# Patient Record
Sex: Male | Born: 1955 | Hispanic: Yes | Smoking: Former smoker
Health system: Southern US, Community
[De-identification: ages and names within clinical notes are randomized; demographics above are authoritative.]

## PROBLEM LIST (undated history)

## (undated) DIAGNOSIS — I10 Essential (primary) hypertension: Secondary | ICD-10-CM

---

## 2019-10-20 ENCOUNTER — Encounter: Payer: Self-pay | Admitting: Pulmonary Disease

## 2019-10-20 ENCOUNTER — Other Ambulatory Visit: Payer: Self-pay

## 2019-10-20 ENCOUNTER — Ambulatory Visit (INDEPENDENT_AMBULATORY_CARE_PROVIDER_SITE_OTHER): Payer: 59 | Admitting: Pulmonary Disease

## 2019-10-20 VITALS — BP 130/68 | HR 72 | Temp 97.7°F | Ht 61.0 in | Wt 176.6 lb

## 2019-10-20 DIAGNOSIS — G4733 Obstructive sleep apnea (adult) (pediatric): Secondary | ICD-10-CM | POA: Diagnosis not present

## 2019-10-20 NOTE — Patient Instructions (Signed)
High probability of significant obstructive sleep apnea  Schedule a home sleep study We will update your results as soon as reviewed  CPAP therapy will be a option of treatment as we discussed  Follow-up in about 3 months  Call with any concerns Apnea del sueo Sleep Apnea La apnea del sueo afecta la respiracin mientras se duerme. Hace que la respiracin se detenga por poco tiempo o se vuelva superficial. Tambin puede aumentar el riesgo de:  Infarto de miocardio.  Accidente cerebrovascular.  Tener mucho sobrepeso (obesidad).  Diabetes.  Insuficiencia cardaca.  Latidos cardacos irregulares. El [place] del tratamiento es ayudarle a respirar normalmente otra vez. Cules son las causas? Existen tres tipos de apnea del sueo:  Apnea obstructiva del sueo. Esta ocurre cuando las vas respiratorias se obstruyen o colapsan.  Apnea central del sueo. Esta ocurre cuando el cerebro no enva las seales correctas a los msculos que controlan la respiracin.  Apnea mixta del sueo. Esta es una combinacin de apnea obstructiva y central del sueo. La causa ms frecuente de esta afeccin es la obstruccin o el colapso de las vas respiratorias. Esto puede suceder si:  Los msculos de la garganta estn demasiado relajados.  Tiene la lengua y las [address].  Tiene sobrepeso.  Tiene las vas respiratorias demasiado pequeas. Qu incrementa el riesgo?  Tener sobrepeso.  Fumar.  Tener vas respiratorias pequeas.  El envejecimiento.  Ser hombre.  El consumo de alcohol.  Tomar medicamentos para calmarse (sedantes o tranquilizantes).  Tener familiares con esta afeccin. Cules son los signos o los sntomas?  Dificultad para permanecer dormido.  Estar somnoliento o cansado Administrator.  Enojarse mucho.  Ronquidos fuertes.  Dolor de cabeza por la maana.  Imposibilidad de enfocar la mente (concentrarse).  Olvidar cosas.  Menos inters  por el sexo.  Cambios en el estado de nimo.  Cambios en la personalidad.  Sentimientos de tristeza (depresin).  Levantarse mucho durante la noche para ir a Geographical information systems officer.  Sequedad en la boca.  Dolor de Advertising copywriter. Cmo se diagnostica?  Sus antecedentes mdicos.  Un examen fsico.  Neomia Dear prueba que se realiza mientras la persona duerme (estudio del sueo). La prueba se realiza con mayor frecuencia en un laboratorio del sueo, pero tambin puede Management consultant. Cmo se trata?   Dormir de Mudlogger.  Usar un medicamento para eliminar la mucosidad de la nariz (descongestivo).  Evitar el consumo de alcohol, medicamentos que ayudan a relajarse o ciertos analgsicos (narcticos).  Bajar de [place], si es necesario.  Cambios en la dieta.  No fumar.  Usar una mquina para abrir las vas respiratorias mientras duerme; por ejemplo: ? Un aparato bucal. Se trata de una boquilla que desplaza la mandbula hacia adelante. ? Un dispositivo CPAP. Este dispositivo sopla aire a travs de una mscara cuando usted exhala. ? Un dispositivo EPAP. Este tiene vlvulas que se colocan en cada fosa nasal. ? Un dispositivo BPAP. Este dispositivo sopla aire a travs de una mscara cuando usted inhala y exhala.  Someterse a Biomedical engineer tratamientos no Comptroller. Realizar un tratamiento para la apnea del sueo es importante. Sin tratamiento, esta afeccin puede derivar en lo siguiente:  Presin arterial alta.  Arteriopata coronaria.  En los hombres, no poder tener una ereccin (impotencia).  Reduccin de la capacidad de pensar. Siga estas instrucciones en su casa: Estilo de [employer] cambios que le haya recomendado el mdico.  Siga una dieta saludable.  Baje de [place],  si es necesario.  Evite el alcohol, los medicamentos para relajarse y Scientific laboratory technician.  No consuma ningn producto que contenga nicotina o tabaco, como cigarrillos, cigarrillos electrnicos y tabaco  de Theatre manager. Si necesita ayuda para dejar de fumar, consulte al [employer]. Instrucciones generales  [employer] de venta libre y los recetados solamente como se lo haya indicado el mdico.  Si le proporcionaron una mquina para usar mientras duerme, sela solamente como se lo haya indicado el mdico.  Si va a someterse a [place], no olvide informarle al mdico que tiene apnea del sueo. Puede ser necesario que lleve su dispositivo consigo.  Concurra a todas las visitas de [address] se lo haya indicado el mdico. Esto es importante. Comunquese con un mdico si:  El Astronomer para usar mientras duerme le [place] o parece no funcionar.  No se siente mejor.  Empeora. Solicite ayuda inmediatamente si:  Le duele el pecho.  Tiene dificultad para inhalar suficiente aire.  Tiene molestias en la espalda, en los brazos o en el [place].  Tiene dificultad para hablar.  Siente debilidad en un lado del cuerpo.  Se le cae un lado de la cara. Estos sntomas pueden Customer service manager. No espere a ver si los sntomas desaparecen. Solicite atencin mdica de inmediato. Comunquese con el servicio de emergencias de su localidad (911 en los [place]). No conduzca por sus propios medios [employer] [facility]. Resumen  Esta afeccin afecta la respiracin durante el sueo.  La causa ms frecuente es la obstruccin o el colapso de las vas respiratorias.  El [place] del tratamiento es ayudarlo a respirar normalmente mientras duerme. Esta informacin no tiene Theme park manager el consejo del mdico. Asegrese de hacerle al mdico cualquier pregunta que tenga. Document Revised: 11/10/2017 Document Reviewed: 11/10/2017 Elsevier Patient Education  2020 [employer].

## 2019-10-20 NOTE — Progress Notes (Signed)
Thomas Glass    [id number]    1955/05/06  Primary Care Physician:Vanstory, Suzan Slick, PA  Referring Physician: Norm Salt, PA [address] [place],  [place] [address]  Chief complaint:   History of significant snoring  HPI:  Snoring, no witnessed apneas Daytime tiredness Usually goes to bed about 6:54 PM Takes him about 5 minutes to fall asleep 3 awakenings Occasionally wakes up from sleep with shortness of breath  No significant changes in his weight  Admits to dryness of his mouth in the morning No headaches  Memory is poor  No family history of obstructive sleep apnea  He has hypertension which is well controlled Heart diabetes well controlled   Outpatient Encounter Medications as of 10/20/2019  Medication Sig  . losartan (COZAAR) 25 MG tablet Take 25 mg by mouth daily.   No facility-administered encounter medications on file as of 10/20/2019.    Allergies as of 10/20/2019  . (Not on File)    History reviewed. No pertinent past medical history.  History reviewed. No pertinent surgical history.  History reviewed. No pertinent family history.  Social History   Socioeconomic History  . Marital status: Media planner    Spouse name: Not on file  . Number of children: Not on file  . Years of education: Not on file  . Highest education level: Not on file  Occupational History  . Not on file  Tobacco Use  . Smoking status: Former Smoker    Packs/day: 0.25    Years: 20.00    Pack years: 5.00    Types: Cigarettes    Quit date: 03/03/1999    Years since quitting: 20.6  . Smokeless tobacco: Never Used  Substance and Sexual Activity  . Alcohol use: Not on file  . Drug use: Not on file  . Sexual activity: Not on file  Other Topics Concern  . Not on file  Social History Narrative  . Not on file   Social Determinants of Health   Financial Resource Strain:   . Difficulty of Paying Living Expenses: Not on file  Food  Insecurity:   . Worried About Programme researcher, broadcasting/film/video in the Last Year: Not on file  . Ran Out of Food in the Last Year: Not on file  Transportation Needs:   . Lack of Transportation (Medical): Not on file  . Lack of Transportation (Non-Medical): Not on file  Physical Activity:   . Days of Exercise per Week: Not on file  . Minutes of Exercise per Session: Not on file  Stress:   . Feeling of Stress : Not on file  Social Connections:   . Frequency of Communication with Friends and Family: Not on file  . Frequency of Social Gatherings with Friends and Family: Not on file  . Attends Religious Services: Not on file  . Active Member of Clubs or Organizations: Not on file  . Attends Banker Meetings: Not on file  . Marital Status: Not on file  Intimate Partner Violence:   . Fear of Current or Ex-Partner: Not on file  . Emotionally Abused: Not on file  . Physically Abused: Not on file  . Sexually Abused: Not on file    Review of Systems  Constitutional: Negative.  Negative for fatigue.  Respiratory: Negative for apnea and shortness of breath.   Psychiatric/Behavioral: Positive for sleep disturbance.    Vitals:   10/20/19 1559  BP: 130/68  Pulse: 72  Temp: 97.7 F (36.5 C)  SpO2: 96%     Physical Exam Constitutional:      Appearance: He is obese.  HENT:     Head: Normocephalic.     Nose: No congestion.     Mouth/Throat:     Mouth: Mucous membranes are moist.     Comments: Mallampati 3, crowded oropharynx Cardiovascular:     Rate and Rhythm: Normal rate and regular rhythm.     Pulses: Normal pulses.     Heart sounds: No murmur heard.  No friction rub.  Pulmonary:     Effort: Pulmonary effort is normal. No respiratory distress.     Breath sounds: Normal breath sounds. No stridor. No wheezing or rhonchi.  Musculoskeletal:        General: Normal range of motion.     Cervical back: No rigidity or tenderness.  Skin:    General: Skin is warm.  Neurological:      General: No focal deficit present.     Mental Status: He is alert.    Results of the Epworth flowsheet 10/20/2019  Sitting and reading 2  Watching TV 3  Sitting, inactive in a public place (e.g. a theatre or a meeting) 0  As a passenger in a car for an hour without a break 0  Lying down to rest in the afternoon when circumstances permit 1  Sitting and talking to someone 2  Sitting quietly after a lunch without alcohol 3  In a car, while stopped for a few minutes in traffic 0  Total score 11    Assessment:  Excessive daytime sleepiness  Moderate probability of significant obstructive sleep apnea  Pathophysiology of sleep disordered breathing discussed with the patient Treatment options for sleep disordered breathing discussed with the patient  Plan/Recommendations: Schedule patient for home sleep study  Risks with not treating sleep disordered breathing discussed with patient  We will see patient in about 3 months, following initiation of CPAP therapy  Encouraged to call with any significant concerns   Virl Diamond MD [facility] 10/20/2019, 4:27 PM  CC: Norm Salt, PA

## 2019-11-20 ENCOUNTER — Ambulatory Visit: Payer: 59

## 2019-11-20 ENCOUNTER — Other Ambulatory Visit: Payer: Self-pay

## 2019-11-20 DIAGNOSIS — G4733 Obstructive sleep apnea (adult) (pediatric): Secondary | ICD-10-CM

## 2019-11-22 ENCOUNTER — Telehealth: Payer: Self-pay | Admitting: Pulmonary Disease

## 2019-11-22 DIAGNOSIS — G4733 Obstructive sleep apnea (adult) (pediatric): Secondary | ICD-10-CM | POA: Diagnosis not present

## 2019-11-22 NOTE — Telephone Encounter (Signed)
Atc via interterprtor LMTCB

## 2019-11-22 NOTE — Telephone Encounter (Signed)
Call patient  Sleep study result  Date of study: 11/21/2019  Impression: Mild obstructive sleep apnea with significant daytime symptoms Normal oxygen levels  Recommendation: CPAP therapy should be considered for treatment of obstructive sleep apnea with daytime symptoms Auto titrating CPAP settings of 5-15 will be appropriate  Another option of treatment will be an oral device-this will require referring you to a dentist to fashion an oral device  Follow-up in 4 to 6 weeks following initiation of treatment

## 2019-11-23 NOTE — Telephone Encounter (Signed)
Spouse not on DPR  Have to speak with the pt only  Called him using [employer] and had to [facility]

## 2019-11-23 NOTE — Telephone Encounter (Signed)
Patient wife called for results [phone] -pr

## 2019-11-27 NOTE — Telephone Encounter (Signed)
Called spoke with pt wife explained pro and cons of cpap therapy and also offered option of  Dental device and pro and cons.pt wife stated he would like to do cpap therapy and informed pt wife he needed to call and make a 4 to 6 week follow up after starting cpap therapy

## 2019-12-21 ENCOUNTER — Telehealth: Payer: Self-pay | Admitting: Pulmonary Disease

## 2019-12-21 NOTE — Telephone Encounter (Addendum)
Called Brad at [employer].  He states Stephanie at [employer] has called pt on 10/6 and today and had to leave a vm - trying to get cpap scheduled.  I called Nilsa and gave her phone # to customer service and told her to tell them she is calling to get cpap scheduled for pt.  She states [employer] has been calling wrong #.  Told her to give them the correct # when she has them on the phone.  Nothing further needed.

## 2020-04-18 DIAGNOSIS — N522 Drug-induced erectile dysfunction: Secondary | ICD-10-CM | POA: Diagnosis not present

## 2020-04-18 DIAGNOSIS — R7303 Prediabetes: Secondary | ICD-10-CM | POA: Diagnosis not present

## 2020-04-18 DIAGNOSIS — Z720 Tobacco use: Secondary | ICD-10-CM | POA: Diagnosis not present

## 2020-04-18 DIAGNOSIS — M7750 Other enthesopathy of unspecified foot: Secondary | ICD-10-CM | POA: Diagnosis not present

## 2020-04-18 DIAGNOSIS — I10 Essential (primary) hypertension: Secondary | ICD-10-CM | POA: Diagnosis not present

## 2020-04-18 DIAGNOSIS — E782 Mixed hyperlipidemia: Secondary | ICD-10-CM | POA: Diagnosis not present

## 2020-07-19 ENCOUNTER — Ambulatory Visit
Admission: EM | Admit: 2020-07-19 | Discharge: 2020-07-19 | Disposition: A | Discharge status: Home / Self Care | Payer: Medicare Other | Attending: Family Medicine | Admitting: Family Medicine

## 2020-07-19 ENCOUNTER — Ambulatory Visit (INDEPENDENT_AMBULATORY_CARE_PROVIDER_SITE_OTHER): Payer: Medicare Other

## 2020-07-19 ENCOUNTER — Other Ambulatory Visit: Payer: Self-pay

## 2020-07-19 DIAGNOSIS — U071 COVID-19: Secondary | ICD-10-CM

## 2020-07-19 DIAGNOSIS — R0602 Shortness of breath: Secondary | ICD-10-CM | POA: Diagnosis not present

## 2020-07-19 DIAGNOSIS — R0902 Hypoxemia: Secondary | ICD-10-CM

## 2020-07-19 DIAGNOSIS — B349 Viral infection, unspecified: Secondary | ICD-10-CM

## 2020-07-19 HISTORY — DX: Essential (primary) hypertension: I10

## 2020-07-19 MED ORDER — CEFDINIR 300 MG PO CAPS: 300.0000 mg | ORAL_CAPSULE | Freq: Two times a day (BID) | ORAL | 0 refills | Status: AC

## 2020-07-19 MED ORDER — ALBUTEROL SULFATE HFA 108 (90 BASE) MCG/ACT IN AERS
2.0000 | INHALATION_SPRAY | Freq: Once | RESPIRATORY_TRACT | Status: AC
  Administered 2020-07-19: 2 via RESPIRATORY_TRACT

## 2020-07-19 MED ORDER — PROMETHAZINE-DM 6.25-15 MG/5ML PO SYRP: 5.0000 mL | ORAL_SOLUTION | Freq: Three times a day (TID) | ORAL | 0 refills | Status: AC | PRN

## 2020-07-19 MED ORDER — DEXAMETHASONE SODIUM PHOSPHATE 10 MG/ML IJ SOLN
10.0000 mg | Freq: Once | INTRAMUSCULAR | Status: AC
  Administered 2020-07-19: 10 mg via INTRAMUSCULAR

## 2020-07-19 NOTE — ED Triage Notes (Addendum)
Pt c/o cough and SOB x4 days. States saw his PCP yesterday and his O2 was 87% and tx's him with antibiotics and steroids. States neg covid test yesterday. States SOB worse on exertion. Pt c/o pain to rt upper back pain and worse when taking a deep breath. Pt in distress, speaking in complete sentences.

## 2020-07-19 NOTE — Discharge Instructions (Signed)
Continue prednisone and azithromycin that you are prescribed by your primary care doctor.  I am adding cefdinir as you do have bilateral pneumonia.  Suspected to be viral pneumonia stemming from what was probably a earlier COVID infection which is since resolved since she tested negative yesterday.  If you experience any worsening shortness of breath, air hunger or difficulty breathing go immediately to the emergency department as this can a sign of a life-threatening emergency.

## 2020-07-19 NOTE — ED Provider Notes (Signed)
[facility]    CSN: [id number] Arrival date & time: 07/19/20  1624      History   Chief Complaint Chief Complaint  Patient presents with  . Cough  . Shortness of Breath    HPI Thomas Glass is a 65 y.o. male.   HPI Patient presents today with worsening SOB and coughing. Seen by PCP yesterday, encounter noted as positive COVID, however, testing was conducted yesterday, and COVID test yesterday was negative. Patient has had respiratory symptoms per spouse for greater than 2 weeks. Only tested for COVID yesterday.  On arrival initial oxygen level 87-88%. Following 2 puffs of albuterol and Decadron IM, oxygen lever improved to 91% and subsequently 93% prior to discharge. Patient is chronic smoker, dx with OSA, doesn't use CPAP. Afebrile.  No known history of CHF. Currently taking a course of prescribed Azithromycin and prednisone.  Past Medical History:  Diagnosis Date  . Hypertension     There are no problems to display for this patient.   History reviewed. No pertinent surgical history.     Home Medications    Prior to Admission medications   Medication Sig Start Date End Date Taking? Authorizing Provider  cefdinir (OMNICEF) 300 MG capsule Take 1 capsule (300 mg total) by mouth 2 (two) times daily for 10 days. 07/19/20 07/29/20 Yes Bing Neighbors, FNP  losartan (COZAAR) 25 MG tablet Take 25 mg by mouth daily.    [provider]    Family History History reviewed. No pertinent family history.  Social History Social History   Tobacco Use  . Smoking status: Current Some Day Smoker    Packs/day: 0.25    Years: 20.00    Pack years: 5.00    Types: Cigarettes    Last attempt to quit: 03/03/1999    Years since quitting: 21.3  . Smokeless tobacco: Never Used  Substance Use Topics  . Alcohol use: Yes  . Drug use: Not Currently     Allergies   Patient has no known allergies.   Review of Systems Review of Systems Pertinent negatives  listed in HPI   Physical Exam Triage Vital Signs ED Triage Vitals  Enc Vitals Group     BP 07/19/20 1910 133/82     Pulse Rate 07/19/20 1910 85     Resp 07/19/20 1910 (!) 24     Temp 07/19/20 1910 98 F (36.7 C)     Temp Source 07/19/20 1910 Oral     SpO2 07/19/20 1910 (!) 87 %     Weight --      Height --      Head Circumference --      Peak Flow --      Pain Score 07/19/20 1913 6     Pain Loc --      Pain Edu? --      Excl. in GC? --    No data found.  Updated Vital Signs BP 133/82 (BP Location: Left Arm)   Pulse 85   Temp 98 F (36.7 C) (Oral)   Resp (!) 24   SpO2 91%   Visual Acuity Right Eye Distance:   Left Eye Distance:   Bilateral Distance:    Right Eye Near:   Left Eye Near:    Bilateral Near:     Physical Exam Constitutional:      Appearance: He is obese. He is ill-appearing. He is not toxic-appearing.  HENT:     Head: Normocephalic.     Mouth/Throat:  Mouth: Mucous membranes are moist.  Eyes:     Pupils: Pupils are equal, round, and reactive to light.  Cardiovascular:     Rate and Rhythm: Normal rate and regular rhythm.  Pulmonary:     Effort: Tachypnea present.     Breath sounds: Decreased breath sounds, wheezing, rhonchi and rales present.  Musculoskeletal:     Cervical back: Normal range of motion.  Skin:    Capillary Refill: Capillary refill takes less than 2 seconds.  Neurological:     General: No focal deficit present.     Mental Status: He is alert.  Psychiatric:        Mood and Affect: Mood normal.        Behavior: Behavior normal.      UC Treatments / Results  Labs (all labs ordered are listed, but only abnormal results are displayed) Labs Reviewed - No data to display  EKG   Radiology DG Chest 2 View  Result Date: 07/19/2020 CLINICAL DATA:  COVID-19 positivity with shortness of breath, initial encounter EXAM: CHEST - 2 VIEW COMPARISON:  None. FINDINGS: Cardiac shadow is within normal limits. The lungs are well  aerated bilaterally. Diffuse airspace opacities are noted bilaterally. Given the patient's clinical history these likely represent sequelae from COVID-19 pneumonia although no prior exams are available for comparison. Upper abdomen and bony structures are within normal limits. IMPRESSION: Bilateral airspace opacities likely related to COVID-19 pneumonia. Follow-up examination is recommended when patient's condition improves. Electronically Signed   By: Alcide Clever M.D.   On: 07/19/2020 19:33    Procedures Procedures (including critical care time)  Medications Ordered in UC Medications  albuterol (VENTOLIN HFA) 108 (90 Base) MCG/ACT inhaler 2 puff (2 puffs Inhalation Given 07/19/20 1923)  dexamethasone (DECADRON) injection 10 mg (10 mg Intramuscular Given 07/19/20 1923)    Initial Impression / Assessment and Plan / UC Course  I have reviewed the triage vital signs and the nursing notes.  Pertinent labs & imaging results that were available during my care of the patient were reviewed by me and considered in my medical decision making (see chart for details).    Seen by PCP tested for COVID which resulted as negative. Given overall presentation and URI symptoms > 2 weeks, suspect at some point, patient likely had COVID-19 and now has a secondary bilateral lobe pneumonia. Discussed at length with patient and spouse that If WOB breathing doesn't improve or worsen, patient immediately needs to be evaluated at the ER.  Give smoking history and non-compliance with therapy for OSA high risk for acute respiratory failure.Continue current medication. Added cefdinir to cover both typical and atypica PNA. Patient and wife verbalized understanding agreement with plan  Final Clinical Impressions(s) / UC Diagnoses   Final diagnoses:  SOB (shortness of breath)  Hypoxic  Viral illness   Discharge Instructions   None    ED Prescriptions    Medication Sig Dispense Auth. Provider   cefdinir (OMNICEF) 300  MG capsule Take 1 capsule (300 mg total) by mouth 2 (two) times daily for 10 days. 20 capsule Bing Neighbors, FNP     PDMP not reviewed this encounter.   Bing Neighbors, FNP 07/19/20 2222

## 2020-07-22 ENCOUNTER — Telehealth: Payer: Self-pay | Admitting: Pulmonary Disease

## 2020-07-22 NOTE — Telephone Encounter (Signed)
Spoke with pt's family member with pt in background. States pt went to [facility] on Friday 07/19/20, pt was diagnosed with pneumonia and sent home. Family member states pt has had increased SOB since that time. Pt's O2 saturation is around 90%. Pt is scheduled with [facility] tomorrow at 11:30. Pt's family member was instructed that if pt's symptoms increase they need to seek an evaluation at either ER or UR. Pt and family member stated understanding. Nothing further needed at this time.

## 2020-07-22 NOTE — Telephone Encounter (Signed)
Lm for Thomas Glass via interpreter services.

## 2020-07-23 ENCOUNTER — Other Ambulatory Visit: Payer: Self-pay

## 2020-07-23 ENCOUNTER — Ambulatory Visit (INDEPENDENT_AMBULATORY_CARE_PROVIDER_SITE_OTHER): Payer: Medicare Other | Admitting: Primary Care

## 2020-07-23 ENCOUNTER — Encounter: Payer: Self-pay | Admitting: Primary Care

## 2020-07-23 VITALS — BP 114/60 | HR 94 | Temp 97.9°F | Ht 64.0 in | Wt 176.8 lb

## 2020-07-23 DIAGNOSIS — G4733 Obstructive sleep apnea (adult) (pediatric): Secondary | ICD-10-CM | POA: Insufficient documentation

## 2020-07-23 DIAGNOSIS — J189 Pneumonia, unspecified organism: Secondary | ICD-10-CM

## 2020-07-23 DIAGNOSIS — J9601 Acute respiratory failure with hypoxia: Secondary | ICD-10-CM | POA: Insufficient documentation

## 2020-07-23 MED ORDER — METHYLPREDNISOLONE ACETATE 80 MG/ML IJ SUSP: 80.0000 mg | Freq: Once | INTRAMUSCULAR | Status: AC

## 2020-07-23 MED ORDER — PREDNISONE 10 MG PO TABS: ORAL_TABLET | ORAL | 0 refills | Status: AC

## 2020-07-23 NOTE — Patient Instructions (Addendum)
Recommendations: - Continue Cefdinir 300mg  twice a day (total 10 days) - Take Mucinex 600mg  twice a day with glass of water  - Take albuterol inhaler 2 puffs every 4-6 hours (take 8am, 12pm, 4pm, 8pm) - Wear 2L oxygen with moderate on exertion and at night  - Start prednisone taper tomorrow   Rx: - Prednisone taper as directed   In Office treatment: - 80mg  IM depo-medrol injection   Orders: - CT chest wo contrast (ordered) - New oxygen start   Follow-up: - Friday May 27th with Dr. at 2:30 or 2:45 please

## 2020-07-23 NOTE — Assessment & Plan Note (Signed)
-   HST 11/21/19 showed mild OSA, AHI 9.6/Hr with SpO2 low 85% with average 94%  - He has not been set up with CPAP  - FU with Dr. Wynona Neat 07/26/20

## 2020-07-23 NOTE — Progress Notes (Signed)
@Patient  ID: , male    DOB: 06/10/1955, 65 y.o.   MRN: [id number]  Chief Complaint  Patient presents with  . Follow-up    Has had increased shortness of breath for the last 5 days. Patient got antibiotic from urgent care. Does not wear oxygen.     Referring provider: [id number], PA  HPI: 65 year old male, current someday smoker.  Past medical history significant for obstructive sleep apnea.  Patient of Dr. 76, last seen in office on 10/20/2019.  07/23/2020 Patient presents today for overdue follow-up. Patient went to [facility] on Friday 07/19/20 with symptoms of shortness of breath and cough. On arrival O2 87-88% RA. He received 2 puffs albuterol hfa and IM Decadron. Oxygen improved to 91-95% RA. Seen by PCP day earlier for suspected covid but testing came back negative. His symptoms started two weeks prior. Prescribed azithromycin and prednisone by PCP. CXR on 07/19/20 showed bilateral airspace opacities likely related to covid 19 pneumonia. It was felt that at some point he likely had covid-19 and now has bilateral pneumonia. When he was at the UC they added cefdinir 300mg  BID 10 days to cover both typical and atypical pna.   Accompanied by his wife and interpreter today. Reports that he is feeling ok. He has a slight cough with some clear mucus. O2 82% RA today with ambulation; O2 93-94% RA at rest.  He tells me that he has had theses symptoms before several years ago. He does not think that he had covid. He has sleep apnea but never received CPAP. Denies f/c/s, chest tightness or wheezing.    No Known Allergies   There is no immunization history on file for this patient.  Past Medical History:  Diagnosis Date  . Hypertension     Tobacco History: Social History   Tobacco Use  Smoking Status Former Smoker  . Packs/day: 0.25  . Years: 20.00  . Pack years: 5.00  . Types: Cigarettes  . Quit date: 03/03/1999  . Years since quitting: 21.4  Smokeless Tobacco Never  Used   Counseling given: Not Answered   Outpatient Medications Prior to Visit  Medication Sig Dispense Refill  . cefdinir (OMNICEF) 300 MG capsule Take 1 capsule (300 mg total) by mouth 2 (two) times daily for 10 days. 20 capsule 0  . losartan (COZAAR) 25 MG tablet Take 25 mg by mouth daily.    . promethazine-dextromethorphan (PROMETHAZINE-DM) 6.25-15 MG/5ML syrup Take 5 mLs by mouth 3 (three) times daily as needed for cough. 140 mL 0   No facility-administered medications prior to visit.   Review of Systems  Review of Systems  Constitutional: Negative.   HENT: Negative.   Respiratory: Positive for shortness of breath. Negative for chest tightness and wheezing.        Rare cough   Physical Exam  BP 114/60 (BP Location: Left Arm, Patient Position: Sitting, Cuff Size: Normal)   Pulse 94   Temp 97.9 F (36.6 C) (Temporal)   Ht 5\' 4"  (1.626 m)   Wt 176 lb 12.8 oz (80.2 kg)   SpO2 95% Comment: 2 liters  BMI 30.35 kg/m  Physical Exam Constitutional:      Appearance: Normal appearance.  HENT:     Head: Normocephalic and atraumatic.  Cardiovascular:     Rate and Rhythm: Normal rate and regular rhythm.  Pulmonary:     Breath sounds: Rales present.  Musculoskeletal:        General: Normal range of motion.  Skin:  General: Skin is warm and dry.  Neurological:     General: No focal deficit present.     Mental Status: He is alert and oriented to person, place, and time. Mental status is at baseline.  Psychiatric:        Mood and Affect: Mood normal.        Behavior: Behavior normal.        Thought Content: Thought content normal.        Judgment: Judgment normal.      Lab Results:  CBC No results found for: WBC, RBC, HGB, HCT, PLT, MCV, MCH, MCHC, RDW, LYMPHSABS, MONOABS, EOSABS, BASOSABS  BMET No results found for: NA, K, CL, CO2, GLUCOSE, BUN, CREATININE, CALCIUM, GFRNONAA, GFRAA  BNP No results found for: BNP  ProBNP No results found for:  PROBNP  Imaging: DG Chest 2 View  Result Date: 07/19/2020 CLINICAL DATA:  COVID-19 positivity with shortness of breath, initial encounter EXAM: CHEST - 2 VIEW COMPARISON:  None. FINDINGS: Cardiac shadow is within normal limits. The lungs are well aerated bilaterally. Diffuse airspace opacities are noted bilaterally. Given the patient's clinical history these likely represent sequelae from COVID-19 pneumonia although no prior exams are available for comparison. Upper abdomen and bony structures are within normal limits. IMPRESSION: Bilateral airspace opacities likely related to COVID-19 pneumonia. Follow-up examination is recommended when patient's condition improves. Electronically Signed   By: Alcide Clever M.D.   On: 07/19/2020 19:33     Assessment & Plan:   Pneumonia - Increased sob x 2 weeks and occasional cough with clear mucus. He feels well today. Treated for pneumonia by PCP with azithromycin and is currently on Cefdinir prescribed by UC. On exam, he has rales to both lungs L>R. Recommend checking CT chest wo contrast d/t persistent pneumonia, concern for possible ILD. Recommend he take mucinex 600mg  twice daily and albuterol HFA 2 puffs every 4-6 hours scheduled. He was given depo-medrol 80mg  IM x1 today in office and we will send in prednisone taper. He will complete cefdinir as prescribed. Close follow-up in office, he has been scheduled for apt on Friday May 27th with Dr. at 2:30. Advised he present to ED if symptoms worsen.   Acute respiratory failure with hypoxia (HCC) - O2 desaturated to 82% RA with ambulation; recovered 94% RA at rest  - Sending patient home with oxygen tank, instructed him to use 2L on exertion and at night - DME order placed for new O2 start   OSA (obstructive sleep apnea) - HST 11/21/19 showed mild OSA, AHI 9.6/Hr with SpO2 low 85% with average 94%  - He has not been set up with CPAP  - FU with Dr. Wynona Neat 07/26/20  >40 mins spent   Wynona Neat, NP 07/23/2020

## 2020-07-23 NOTE — Assessment & Plan Note (Addendum)
-   Increased sob x 2 weeks and occasional cough with clear mucus. He feels well today. Treated for pneumonia by PCP with azithromycin and is currently on Cefdinir prescribed by UC. On exam, he has rales to both lungs L>R. Recommend checking CT chest wo contrast d/t persistent pneumonia, concern for possible ILD. Recommend he take mucinex 600mg  twice daily and albuterol HFA 2 puffs every 4-6 hours scheduled. He was given depo-medrol 80mg  IM x1 today in office and we will send in prednisone taper. He will complete cefdinir as prescribed. Close follow-up in office, he has been scheduled for apt on Friday May 27th with Dr. at 2:30. Advised he present to ED if symptoms worsen.

## 2020-07-23 NOTE — Assessment & Plan Note (Addendum)
-   O2 desaturated to 82% RA with ambulation; recovered 94% RA at rest  - Sending patient home with oxygen tank, instructed him to use 2L on exertion and at night - DME order placed for new O2 start

## 2020-07-26 ENCOUNTER — Other Ambulatory Visit: Payer: Self-pay

## 2020-07-26 ENCOUNTER — Encounter: Payer: Self-pay | Admitting: Pulmonary Disease

## 2020-07-26 ENCOUNTER — Ambulatory Visit (INDEPENDENT_AMBULATORY_CARE_PROVIDER_SITE_OTHER): Payer: Medicare Other | Admitting: Pulmonary Disease

## 2020-07-26 VITALS — BP 112/68 | HR 90 | Temp 98.5°F | Ht 64.0 in | Wt 175.0 lb

## 2020-07-26 DIAGNOSIS — G4733 Obstructive sleep apnea (adult) (pediatric): Secondary | ICD-10-CM

## 2020-07-26 MED ORDER — CHLORPROMAZINE HCL 50 MG PO TABS: 50.0000 mg | ORAL_TABLET | Freq: Two times a day (BID) | ORAL | 1 refills | Status: DC | PRN

## 2020-07-26 NOTE — Patient Instructions (Signed)
DME referral for CPAP therapy Auto titrating CPAP 5-15  Prescription for chlorpromazine for his hiccups sent to pharmacy  Oxygen supplementation  I will see him back in 4 weeks, he should get a chest x-ray on the day of the visit

## 2020-07-26 NOTE — Progress Notes (Signed)
Thomas Glass    [id number]    09/20/1955  Primary Care Physician:Vanstory, Thomas Glass  Referring Physician: Norm Salt, Glass [address] [place],  [place] [address]  Chief complaint:   Recent diagnosis of pneumonia  HPI:  Diagnosed with mild sleep apnea Was not set up  Still having issues with his sleep at night  Complaining about a lot of hiccups preventing him from sleeping at night He did use chlorpromazine in the past that helped  Was having episodes of shortness of breath and went to urgent care where he was diagnosed with pneumonia, started on Omnicef  Shortness of breath and cough is getting better  Was in the office 3 days ago and was started on oxygen supplementation  Memory is poor  No family history of obstructive sleep apnea  He has hypertension which is well controlled Heart diabetes well controlled   Outpatient Encounter Medications as of 07/26/2020  Medication Sig  . cefdinir (OMNICEF) 300 MG capsule Take 1 capsule (300 mg total) by mouth 2 (two) times daily for 10 days.  . chlorproMAZINE (THORAZINE) 50 MG tablet Take 1 tablet (50 mg total) by mouth 2 (two) times daily as needed.  Marland Kitchen losartan (COZAAR) 25 MG tablet Take 25 mg by mouth daily.  . predniSONE (DELTASONE) 10 MG tablet Take 4 tabs po daily x 3 days; then 3 tabs daily x3 days; then 2 tabs daily x3 days; then 1 tab daily x 3 days; then stop  . promethazine-dextromethorphan (PROMETHAZINE-DM) 6.25-15 MG/5ML syrup Take 5 mLs by mouth 3 (three) times daily as needed for cough.   Facility-Administered Encounter Medications as of 07/26/2020  Medication  . methylPREDNISolone acetate (DEPO-MEDROL) injection 80 mg    Allergies as of 07/26/2020  . (No Known Allergies)    Past Medical History:  Diagnosis Date  . Hypertension     No past surgical history on file.  No family history on file.  Social History   Socioeconomic History  . Marital status: Media planner     Spouse name: Not on file  . Number of children: Not on file  . Years of education: Not on file  . Highest education level: Not on file  Occupational History  . Not on file  Tobacco Use  . Smoking status: Former Smoker    Packs/day: 0.25    Years: 20.00    Pack years: 5.00    Types: Cigarettes    Quit date: 03/03/1999    Years since quitting: 21.4  . Smokeless tobacco: Never Used  Vaping Use  . Vaping Use: Never used  Substance and Sexual Activity  . Alcohol use: Yes  . Drug use: Not Currently  . Sexual activity: Not on file  Other Topics Concern  . Not on file  Social History Narrative  . Not on file   Social Determinants of Health   Financial Resource Strain: Not on file  Food Insecurity: Not on file  Transportation Needs: Not on file  Physical Activity: Not on file  Stress: Not on file  Social Connections: Not on file  Intimate Partner Violence: Not on file    Review of Systems  Constitutional: Negative.  Negative for fatigue.  Respiratory: Positive for cough. Negative for apnea and shortness of breath.   Psychiatric/Behavioral: Positive for sleep disturbance.    Vitals:   07/26/20 1415  BP: 112/68  Pulse: 90  Temp: 98.5 F (36.9 C)  SpO2: (!) 89%  Physical Exam Constitutional:      Appearance: He is obese.  HENT:     Head: Normocephalic.     Nose: No congestion.     Mouth/Throat:     Mouth: Mucous membranes are moist.     Comments: Mallampati 3, crowded oropharynx Cardiovascular:     Rate and Rhythm: Normal rate and regular rhythm.     Pulses: Normal pulses.     Heart sounds: No murmur heard. No friction rub.  Pulmonary:     Effort: Pulmonary effort is normal. No respiratory distress.     Breath sounds: No stridor. Rales present. No wheezing or rhonchi.  Musculoskeletal:        General: Normal range of motion.     Cervical back: No rigidity or tenderness.  Skin:    General: Skin is warm.  Neurological:     General: No focal deficit  present.     Mental Status: He is alert.    Results of the Epworth flowsheet 10/20/2019  Sitting and reading 2  Watching TV 3  Sitting, inactive in a public place (e.g. a theatre or a meeting) 0  As a passenger in a car for an hour without a break 0  Lying down to rest in the afternoon when circumstances permit 1  Sitting and talking to someone 2  Sitting quietly after a lunch without alcohol 3  In a car, while stopped for a few minutes in traffic 0  Total score 11   Previous sleep study reviewed-11/21/2019 showing mild obstructive sleep apnea  Assessment:   Bibasal pneumonia  Hypoxemic respiratory failure -We will have him walk around today to see if he still needs oxygen -He was ambulated in the office and required 4 L of oxygen to keep saturations over 90% -  Mild obstructive sleep apnea with significant daytime sleepiness and nonrestorative sleep -Will benefit from CPAP therapy  Plan/Recommendations: DME referral Risks with not treating sleep disordered breathing discussed with patient -Auto titrating CPAP 5-15  He will complete his course of antibiotics  Chlorpromazine prescription sent in 50 mg p.o. twice daily as needed  Oxygen supplementation at 4 L  I will see him in 4 weeks, we will get a repeat chest x-ray at the time  Encouraged to call with any significant concerns  I spent 30 minutes dedicated to the care of this patient on the date of this encounter to include previsit review of records, face-to-face time with the patient discussing conditions above, post visit ordering of testing, clinical documentation with electronic health record and communicated necessary findings to members of the patient's care team  Thomas Glass [facility] 07/26/2020, 2:33 PM  CC: Thomas Glass

## 2020-08-04 ENCOUNTER — Emergency Department (HOSPITAL_COMMUNITY): Payer: Medicare Other

## 2020-08-04 ENCOUNTER — Other Ambulatory Visit: Payer: Self-pay

## 2020-08-04 ENCOUNTER — Encounter (HOSPITAL_COMMUNITY): Payer: Self-pay | Admitting: Emergency Medicine

## 2020-08-04 ENCOUNTER — Inpatient Hospital Stay (HOSPITAL_COMMUNITY)
Admission: EM | Admit: 2020-08-04 | Discharge: 2020-08-30 | DRG: 196 | Disposition: E | Payer: Medicare Other | Attending: Internal Medicine | Admitting: Internal Medicine

## 2020-08-04 DIAGNOSIS — F418 Other specified anxiety disorders: Secondary | ICD-10-CM | POA: Diagnosis not present

## 2020-08-04 DIAGNOSIS — Z8701 Personal history of pneumonia (recurrent): Secondary | ICD-10-CM

## 2020-08-04 DIAGNOSIS — H0520 Unspecified exophthalmos: Secondary | ICD-10-CM | POA: Diagnosis present

## 2020-08-04 DIAGNOSIS — Z720 Tobacco use: Secondary | ICD-10-CM | POA: Diagnosis present

## 2020-08-04 DIAGNOSIS — Z20822 Contact with and (suspected) exposure to covid-19: Secondary | ICD-10-CM | POA: Diagnosis present

## 2020-08-04 DIAGNOSIS — J479 Bronchiectasis, uncomplicated: Secondary | ICD-10-CM | POA: Diagnosis present

## 2020-08-04 DIAGNOSIS — T380X5A Adverse effect of glucocorticoids and synthetic analogues, initial encounter: Secondary | ICD-10-CM | POA: Diagnosis not present

## 2020-08-04 DIAGNOSIS — Z6830 Body mass index (BMI) 30.0-30.9, adult: Secondary | ICD-10-CM

## 2020-08-04 DIAGNOSIS — Z0184 Encounter for antibody response examination: Secondary | ICD-10-CM

## 2020-08-04 DIAGNOSIS — R739 Hyperglycemia, unspecified: Secondary | ICD-10-CM

## 2020-08-04 DIAGNOSIS — J84112 Idiopathic pulmonary fibrosis: Principal | ICD-10-CM | POA: Diagnosis present

## 2020-08-04 DIAGNOSIS — J9621 Acute and chronic respiratory failure with hypoxia: Secondary | ICD-10-CM | POA: Diagnosis not present

## 2020-08-04 DIAGNOSIS — Z515 Encounter for palliative care: Secondary | ICD-10-CM

## 2020-08-04 DIAGNOSIS — G4733 Obstructive sleep apnea (adult) (pediatric): Secondary | ICD-10-CM | POA: Diagnosis not present

## 2020-08-04 DIAGNOSIS — R0902 Hypoxemia: Secondary | ICD-10-CM

## 2020-08-04 DIAGNOSIS — I10 Essential (primary) hypertension: Secondary | ICD-10-CM | POA: Diagnosis not present

## 2020-08-04 DIAGNOSIS — J189 Pneumonia, unspecified organism: Secondary | ICD-10-CM

## 2020-08-04 DIAGNOSIS — E1165 Type 2 diabetes mellitus with hyperglycemia: Secondary | ICD-10-CM | POA: Diagnosis not present

## 2020-08-04 DIAGNOSIS — J80 Acute respiratory distress syndrome: Secondary | ICD-10-CM | POA: Diagnosis present

## 2020-08-04 DIAGNOSIS — Z9981 Dependence on supplemental oxygen: Secondary | ICD-10-CM

## 2020-08-04 DIAGNOSIS — J9601 Acute respiratory failure with hypoxia: Secondary | ICD-10-CM | POA: Diagnosis present

## 2020-08-04 DIAGNOSIS — Z66 Do not resuscitate: Secondary | ICD-10-CM | POA: Diagnosis not present

## 2020-08-04 DIAGNOSIS — E669 Obesity, unspecified: Secondary | ICD-10-CM | POA: Diagnosis present

## 2020-08-04 DIAGNOSIS — Z87891 Personal history of nicotine dependence: Secondary | ICD-10-CM

## 2020-08-04 LAB — COMPREHENSIVE METABOLIC PANEL
ALT: 19 U/L (ref 0–44)
AST: 20 U/L (ref 15–41)
Albumin: 3.3 g/dL — ABNORMAL LOW (ref 3.5–5.0)
Alkaline Phosphatase: 64 U/L (ref 38–126)
Anion gap: 7 (ref 5–15)
BUN: 18 mg/dL (ref 8–23)
CO2: 26 mmol/L (ref 22–32)
Calcium: 8.9 mg/dL (ref 8.9–10.3)
Chloride: 101 mmol/L (ref 98–111)
Creatinine, Ser: 0.96 mg/dL (ref 0.61–1.24)
GFR, Estimated: >60 mL/min (ref >60)
Glucose, Bld: 120 mg/dL — ABNORMAL HIGH (ref 70–99)
Potassium: 4.3 mmol/L (ref 3.5–5.1)
Sodium: 134 mmol/L — ABNORMAL LOW (ref 135–145)
Total Bilirubin: 0.9 mg/dL (ref 0.3–1.2)
Total Protein: 7.3 g/dL (ref 6.5–8.1)

## 2020-08-04 LAB — D-DIMER, QUANTITATIVE: D-Dimer, Quant: 1.59 ug/mL-FEU — ABNORMAL HIGH (ref 0.00–0.50)

## 2020-08-04 LAB — CBC WITH DIFFERENTIAL/PLATELET
Abs Immature Granulocytes: 0.1 10*3/uL — ABNORMAL HIGH (ref 0.00–0.07)
Basophils Absolute: 0.1 10*3/uL (ref 0.0–0.1)
Basophils Relative: 0 %
Eosinophils Absolute: 0.1 10*3/uL (ref 0.0–0.5)
Eosinophils Relative: 1 %
HCT: 42.3 % (ref 39.0–52.0)
Hemoglobin: 13.9 g/dL (ref 13.0–17.0)
Immature Granulocytes: 1 %
Lymphocytes Relative: 11 %
Lymphs Abs: 1.7 10*3/uL (ref 0.7–4.0)
MCH: 29.4 pg (ref 26.0–34.0)
MCHC: 32.9 g/dL (ref 30.0–36.0)
MCV: 89.4 fL (ref 80.0–100.0)
Monocytes Absolute: 1.3 10*3/uL — ABNORMAL HIGH (ref 0.1–1.0)
Monocytes Relative: 8 %
Neutro Abs: 12.5 10*3/uL — ABNORMAL HIGH (ref 1.7–7.7)
Neutrophils Relative %: 79 %
Platelets: 218 10*3/uL (ref 150–400)
RBC: 4.73 MIL/uL (ref 4.22–5.81)
RDW: 15.2 % (ref 11.5–15.5)
WBC: 15.8 10*3/uL — ABNORMAL HIGH (ref 4.0–10.5)
nRBC: 0 % (ref 0.0–0.2)

## 2020-08-04 LAB — RESP PANEL BY RT-PCR (FLU A&B, COVID) ARPGX2
Influenza A by PCR: NEGATIVE
Influenza B by PCR: NEGATIVE
SARS Coronavirus 2 by RT PCR: NEGATIVE

## 2020-08-04 LAB — TROPONIN I (HIGH SENSITIVITY)
Troponin I (High Sensitivity): 7 ng/L (ref <18)
Troponin I (High Sensitivity): 8 ng/L (ref <18)

## 2020-08-04 LAB — BLOOD GAS, ARTERIAL
Acid-Base Excess: 0.7 mmol/L (ref 0.0–2.0)
Bicarbonate: 24.1 mmol/L (ref 20.0–28.0)
FIO2: 36
O2 Saturation: 89.2 %
Patient temperature: 98.6
pCO2 arterial: 36.6 mmHg (ref 32.0–48.0)
pH, Arterial: 7.435 (ref 7.350–7.450)
pO2, Arterial: 56.4 mmHg — ABNORMAL LOW (ref 83.0–108.0)

## 2020-08-04 LAB — PROCALCITONIN: Procalcitonin: <0.1 ng/mL

## 2020-08-04 LAB — BRAIN NATRIURETIC PEPTIDE: B Natriuretic Peptide: 45.5 pg/mL (ref 0.0–100.0)

## 2020-08-04 LAB — LIPASE, BLOOD: Lipase: 27 U/L (ref 11–51)

## 2020-08-04 MED ORDER — IOHEXOL 350 MG/ML SOLN
80.0000 mL | Freq: Once | INTRAVENOUS | Status: AC | PRN
  Administered 2020-08-04: 80 mL via INTRAVENOUS

## 2020-08-04 MED ORDER — ENOXAPARIN SODIUM 40 MG/0.4ML IJ SOSY
40.0000 mg | PREFILLED_SYRINGE | INTRAMUSCULAR | Status: DC
  Administered 2020-08-04 – 2020-08-07 (×4): 40 mg via SUBCUTANEOUS
  Filled 2020-08-04 (×4): qty 0.4

## 2020-08-04 MED ORDER — METHYLPREDNISOLONE SODIUM SUCC 40 MG IJ SOLR
40.0000 mg | INTRAMUSCULAR | Status: DC
  Administered 2020-08-04: 40 mg via INTRAVENOUS
  Filled 2020-08-04: qty 1

## 2020-08-04 MED ORDER — IPRATROPIUM-ALBUTEROL 0.5-2.5 (3) MG/3ML IN SOLN
3.0000 mL | Freq: Once | RESPIRATORY_TRACT | Status: AC
  Administered 2020-08-04: 3 mL via RESPIRATORY_TRACT
  Filled 2020-08-04: qty 3

## 2020-08-04 MED ORDER — LOSARTAN POTASSIUM 25 MG PO TABS
25.0000 mg | ORAL_TABLET | Freq: Every day | ORAL | Status: DC
  Administered 2020-08-05: 25 mg via ORAL
  Filled 2020-08-04: qty 1

## 2020-08-04 MED ORDER — IPRATROPIUM-ALBUTEROL 0.5-2.5 (3) MG/3ML IN SOLN
3.0000 mL | RESPIRATORY_TRACT | Status: DC
  Administered 2020-08-04 – 2020-08-05 (×7): 3 mL via RESPIRATORY_TRACT
  Filled 2020-08-04 (×7): qty 3

## 2020-08-04 NOTE — ED Notes (Signed)
Patient returned from CT with complaints of worsening SOB. MD notified. Respiratory called for ABG per Dr. Delford Field verbal order.

## 2020-08-04 NOTE — H&P (Signed)
History and Physical    Thomas Glass BHA:[id number] DOB: 14-Oct-1955 DOA: 08/01/2020  PCP: Norm Salt, PA  Patient coming from: Home  I have personally briefly reviewed patient's old medical records in [facility]  Chief Complaint: worsening shortness of breath  HPI: Thomas Glass is a 65 y.o. male with medical history significant for  HTN, OSA not yet on CPAP who presents with progressive worsening shortness of breath and cough.  Patient began to have symptoms around the middle of May with increasing shortness of breath with exertion and cough productive of white sputum.  He was first evaluated by his PCP and started on prednisone and azithromycin. Reportedly had negative COVID test.  However he did not have any improvement and presented to urgent care on 5/20.  He was noted to be hypoxic down to 80% on room air which improved following albuterol and IM Decadron.  Had negative COVID test again.  He was given a course of cefdinir.  Then on 5/24 he was seen by pulmonology and was started on 2 L of oxygen to use with exertion and at night.  Also given Mucinex, albuterol and IM steroids.  However he continued to have worsening symptoms and had a repeat follow-up with pulmonology on 5/27 where his oxygen was increased to 4 L. For the past several days he had to use O2 Continuously and could not walk several feet without shortness of breath.  Had an episode of chills about 2 days ago but did not take temperature.  Also had 1 episode of chest pain yesterday for several minutes when laying down.  He has also not been able to lay flat and has to sleep in a reclined.  Denies any lower extremity edema. Only used albuterol inhaler once today.  No recent travel. 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 Work is strict about wearing mask and denies coworkers with similar symptoms.  He endorses occasional tobacco use for many years but usually only smokes during gathering.  He  reports a history of pneumonia about 30 years ago where he required mechanical ventilation.  Denies any family history of any pulmonary diseases.  He was recently diagnosed with OSA several months ago and is working with pulm to get his CPAP.  ED Course: He was afrebile with borderline blood pressure of 102/58. EMS noted pt to be 77% with O2.Now on 4L.  WBC of 15.8.  Sodium 134.  BG 120.  ABG reassuring.  CTA chest significant motion artifacts so PE study was limited but he was noted to have diffuse alveolar damage typical with atypical infection, drug reaction versus connective tissue disorder. COVID and flu negative.   Review of Systems:   Constitutional: No Weight Change, questionable Fever ENT/Mouth: No sore throat, No Rhinorrhea Eyes: No Eye Pain, No Vision Changes Cardiovascular: No Chest Pain, + SOB, No PND, + Dyspnea on Exertion, + Orthopnea, No Claudication, No Edema, No Palpitations Respiratory: + Cough,+ Sputum, No Wheezing, + Dyspnea  Gastrointestinal: No Nausea, No Vomiting, No Diarrhea, No Constipation, No Pain Genitourinary: no Urinary Incontinence, No Urgency, No Flank Pain Musculoskeletal: No Arthralgias, No Myalgias Skin: No Skin Lesions, No Pruritus, Neuro: no Weakness, No Numbness, Psych: No Anxiety/Panic, No Depression, no decrease appetite Heme/Lymph: No Bruising, No Bleeding Past Medical History:  Diagnosis Date  . Hypertension     History reviewed. No pertinent surgical history.   reports that he quit smoking about 21 years ago. His smoking use included cigarettes.  He has a 5.00 pack-year smoking history. He has never used smokeless tobacco. He reports current alcohol use. He reports previous drug use. Social History  No Known Allergies  History reviewed. No pertinent family history.   Prior to Admission medications   Medication Sig Start Date End Date Taking? Authorizing Provider  albuterol (VENTOLIN HFA) 108 (90 Base) MCG/ACT inhaler Inhale 2 puffs into  the lungs every 6 (six) hours as needed for wheezing or shortness of breath.   Yes [provider]  Ascorbic Acid (VITAMIN C PO) Take 1 tablet by mouth daily.   Yes [provider]  cefdinir (OMNICEF) 300 MG capsule Take 300 mg by mouth 2 (two) times daily.   Yes [provider]  Cyanocobalamin (VITAMIN B-12 PO) Take 1 tablet by mouth daily.   Yes [provider]  ibuprofen (ADVIL) 200 MG tablet Take 400 mg by mouth every 6 (six) hours as needed for fever.   Yes [provider]  losartan (COZAAR) 25 MG tablet Take 25 mg by mouth daily.   Yes [provider]  MAGNESIUM PO Take 1 tablet by mouth daily.   Yes [provider]  OXYGEN Inhale 2 L into the lungs continuous.   Yes [provider]  predniSONE (DELTASONE) 10 MG tablet Take 4 tabs po daily x 3 days; then 3 tabs daily x3 days; then 2 tabs daily x3 days; then 1 tab daily x 3 days; then stop Patient taking differently: Take 10-40 mg by mouth See admin instructions. Take 4 tabs (40 mg) by mouth po daily x 3 days; then 3 tabs (30 mg) daily x3 days; then 2 tabs  (20 mg) daily x3 days; then 1 tab  (10 mg) daily x 3 days; then stop 07/23/20  Yes Glenford Bayley, NP  azithromycin (ZITHROMAX) 250 MG tablet Take 250-500 mg by mouth See admin instructions. Take 2 tablets (500 mg) by mouth 1st day, then take 1 tablet (250 mg) daily on days 2-5 07/18/20   [provider]  chlorproMAZINE (THORAZINE) 50 MG tablet Take 1 tablet (50 mg total) by mouth 2 (two) times daily as needed. Patient not taking: No sig reported 07/26/20   Tomma Lightning, MD  promethazine-dextromethorphan (PROMETHAZINE-DM) 6.25-15 MG/5ML syrup Take 5 mLs by mouth 3 (three) times daily as needed for cough. Patient not taking: Reported on 08/03/2020 07/19/20   Bing Neighbors, FNP    Physical Exam: Vitals:   08/22/2020 1645 08/14/2020 1745 08/16/2020 1815 08/20/2020 1940  BP: 124/72 107/63 116/65 (!) 102/56   Pulse: 84 84 75 69  Resp: (!) 34 (!) 30 (!) 28 (!) 21  Temp:      TempSrc:      SpO2: 98% 93% 93% 91%    Constitutional: NAD, calm, comfortable, non-toxic appearing elderly male appearing younger than stated age sitting upright in bed eating pizza Vitals:   08/13/2020 1645 08/26/2020 1745 08/23/2020 1815 08/29/2020 1940  BP: 124/72 107/63 116/65 (!) 102/56  Pulse: 84 84 75 69  Resp: (!) 34 (!) 30 (!) 28 (!) 21  Temp:      TempSrc:      SpO2: 98% 93% 93% 91%   Eyes: PERRL, lids and conjunctivae normal ENMT: Mucous membranes are moist.  Neck: normal, supple Respiratory: bibasilar ronchi without wheezing. Normal respiratory effort on 4L. Able to speak full sentences. No accessory muscle use.  Cardiovascular: Regular rate and rhythm, no murmurs / rubs / gallops. No extremity edema.   Abdomen:  no tenderness, no masses palpated. Bowel sounds positive.  Musculoskeletal: no clubbing / cyanosis. No joint deformity upper and lower extremities. Good ROM, no contractures. Normal muscle tone.  Skin: no rashes, lesions, ulcers. No induration Neurologic: CN 2-12 grossly intact. Sensation intact. Strength 5/5 in all 4.  Psychiatric: Normal judgment and insight. Alert and oriented x 3. Normal mood.     Labs on Admission: I have personally reviewed following labs and imaging studies  CBC: Recent Labs  Lab 08/09/2020 1602  WBC 15.8*  NEUTROABS 12.5*  HGB 13.9  HCT 42.3  MCV 89.4  PLT 218   Basic Metabolic Panel: Recent Labs  Lab 08/11/2020 1724  NA 134*  K 4.3  CL 101  CO2 26  GLUCOSE 120*  BUN 18  CREATININE 0.96  CALCIUM 8.9   GFR: Estimated Creatinine Clearance: 73 mL/min (by C-G formula based on SCr of 0.96 mg/dL). Liver Function Tests: Recent Labs  Lab 08/10/2020 1724  AST 20  ALT 19  ALKPHOS 64  BILITOT 0.9  PROT 7.3  ALBUMIN 3.3*   Recent Labs  Lab 08/02/2020 1724  LIPASE 27   No results for input(s): AMMONIA in the last 168 hours. Coagulation Profile: No results for  input(s): INR, PROTIME in the last 168 hours. Cardiac Enzymes: No results for input(s): CKTOTAL, CKMB, CKMBINDEX, TROPONINI in the last 168 hours. BNP (last 3 results) No results for input(s): PROBNP in the last 8760 hours. HbA1C: No results for input(s): HGBA1C in the last 72 hours. CBG: No results for input(s): GLUCAP in the last 168 hours. Lipid Profile: No results for input(s): CHOL, HDL, LDLCALC, TRIG, CHOLHDL, LDLDIRECT in the last 72 hours. Thyroid Function Tests: No results for input(s): TSH, T4TOTAL, FREET4, T3FREE, THYROIDAB in the last 72 hours. Anemia Panel: No results for input(s): VITAMINB12, FOLATE, FERRITIN, TIBC, IRON, RETICCTPCT in the last 72 hours. Urine analysis: No results found for: COLORURINE, APPEARANCEUR, LABSPEC, PHURINE, GLUCOSEU, HGBUR, BILIRUBINUR, KETONESUR, PROTEINUR, UROBILINOGEN, NITRITE, LEUKOCYTESUR  Radiological Exams on Admission: CT Angio Chest PE W and/or Wo Contrast  Result Date: 08/06/2020 CLINICAL DATA:  Pneumonia, hypoxia, cough, elevated D-dimer EXAM: CT ANGIOGRAPHY CHEST WITH CONTRAST TECHNIQUE: Multidetector CT imaging of the chest was performed using the standard protocol during bolus administration of intravenous contrast. Multiplanar CT image reconstructions and MIPs were obtained to evaluate the vascular anatomy. CONTRAST:  80mL OMNIPAQUE IOHEXOL 350 MG/ML SOLN COMPARISON:  None. FINDINGS: Cardiovascular: The examination is markedly limited by motion artifact as the patient coughed during the initial scan. Subsequent imaging was performed essentially during venous phase of enhancement. As result, the examination is adequate only for exclusion of intraluminal filling defect within the main and central right and left pulmonary arteries of which there is none. The pulmonary arterial vasculature distally is inadequately imaged to definitively exclude the presence of small intraluminal filling defect. The central pulmonary arteries are of normal  caliber. Extensive coronary artery calcification. Global cardiac size within normal limits. No pericardial effusion. The thoracic aorta is of normal caliber. Mild atherosclerotic calcification noted within the a thoracic aorta. Mediastinum/Nodes: There is shotty mediastinal and bilateral hilar adenopathy, possibly reactive in nature. No frankly pathologically enlarged lymph nodes are identified within the thorax, however. Small hiatal hernia. Esophagus unremarkable. The visualized thyroid is unremarkable. Lungs/Pleura: Lung volumes are small, but are symmetric. There is extensive bilateral ground-glass pulmonary infiltrate and patchy consolidation demonstrating a peripheral and mid lung zone predominance with traction bronchiolectasis, septal thickening best appreciated within the lung bases, and subtle subpleural  fibrosis. The findings are suggestive of acute to organizing phase of diffuse alveolar damage and can be seen in subacute atypical infection, including viral pneumonia such as COVID 19 pneumonia, drug reaction, and connective tissue disorders. No pneumothorax or pleural effusion. The central airways are widely patent. Upper Abdomen: No acute abnormality. Musculoskeletal: No acute bone abnormality. No lytic or blastic bone lesions. Review of the MIP images confirms the above findings. IMPRESSION: Widespread pulmonary parenchymal abnormality most suggestive of subacute diffuse lung injury/diffuse alveolar damage. Differential considerations are as listed above. Serologic correlation for COVID-19 pneumonia may be helpful for further management. Technically limited examination without evidence of large central pulmonary embolism. Extensive coronary artery calcification Aortic Atherosclerosis (ICD10-I70.0). Electronically Signed   By: Helyn Numbers MD   On: 08/16/2020 19:48   DG Chest Port 1 View  Result Date: 08/10/2020 CLINICAL DATA:  Recent pneumonia, was on home oxygen and ran out of oxygen this  afternoon, oxygen desaturation EXAM: PORTABLE CHEST 1 VIEW COMPARISON:  Portable exam 1608 hours compared to 07/19/2020 FINDINGS: Upper normal size of cardiac silhouette. Slightly rotated to the RIGHT. Extensive BILATERAL pulmonary infiltrates, probably slightly increased. No pleural effusion or pneumothorax. No acute osseous findings. IMPRESSION: Diffuse BILATERAL pulmonary infiltrates consistent with multifocal pneumonia, probably slightly increased. Electronically Signed   By: Ulyses Southward M.D.   On: 08/01/2020 16:19      Assessment/Plan  Acute on chronic hypoxic respiratory failure secondary to likely viral pneumonia questionable sarcodosis -pt has received 2 course of antibiotics with azithromycin and cefdinir as well as steroids without improvement.  He was started on 2L as needed oxygen by pulmonology outpatient on 5/24 but had to be increase to 4L - ABG reassuring  - scheduled q4hr duoneb, daily IV solu-medrol - check pro-calcitonin, RVP, Sputum culture, Legionella and Strep pneum urine  -obtain ACE and CRP- bilateral hilar adenopathy seen on CT  - consider pulm if no clinical improvement   HTN -continue Losartan   OSA not yet on CPAP.Currently following up with pulm.   DVT prophylaxis:.Lovenox Code Status: Full Family Communication: Plan discussed with patient at bedside  disposition Plan: Home with observation Consults called:  Admission status: Observation  Level of care: Med-Surg  Status is: Observation  The patient remains OBS appropriate and will d/c before 2 midnights.  Dispo: The patient is from: Home              Anticipated d/c is to: Home              Patient currently is not medically stable to d/c.   Difficult to place patient No         Anselm Jungling DO [facility]   If 7PM-7AM, please contact night-coverage [email or web address]   08/02/2020, 9:20 PM

## 2020-08-04 NOTE — ED Provider Notes (Signed)
[facility]-EMERGENCY DEPT Provider Note   CSN: [id number] Arrival date & time: 08/02/2020  1529     History Chief Complaint  Patient presents with  . Shortness of Breath    Thomas Glass is a 65 y.o. male.  Respiratory symptoms began roughly the first week of May.  He was seen in urgent care on May 20, and diagnosed with pneumonia.  He followed up with pulmonology on May 25.  He has been treated with cefdinir and azithromycin.  He has also been given prednisone.  X-rays reveal multifocal pneumonia, and there was a presumptive diagnosis of COVID-19 though he did not test at the onset of his illness and had a negative test approximately 2 weeks into it.  He was prescribed 4 L of oxygen via nasal cannula.  He states that for the past week he has been without his oxygen because when his wife calls the company, they tell her they will call her back but they do not.  He has had shortness of breath with even minimal exertion such as ambulating to the bathroom.  Today, he had what he calls an attack of shortness of breath, and EMS was called.  They noted that his O2 sat was in the 70s.  The history is provided by the patient. A language interpreter was used.  Shortness of Breath Severity:  Severe Onset quality:  Gradual Timing:  Intermittent Progression:  Waxing and waning Chronicity:  Recurrent Relieved by:  Nothing Worsened by:  Nothing Ineffective treatments:  Inhaler and oxygen Associated symptoms: chest pain (intermittent) and cough (productive of white sputum)   Associated symptoms: no abdominal pain, no ear pain, no fever, no rash, no sore throat and no vomiting        Past Medical History:  Diagnosis Date  . Hypertension     Patient Active Problem List   Diagnosis Date Noted  . Pneumonia 07/23/2020  . Acute respiratory failure with hypoxia (HCC) 07/23/2020  . OSA (obstructive sleep apnea) 07/23/2020    History reviewed. No pertinent surgical  history.     History reviewed. No pertinent family history.  Social History   Tobacco Use  . Smoking status: Former Smoker    Packs/day: 0.25    Years: 20.00    Pack years: 5.00    Types: Cigarettes    Quit date: 03/03/1999    Years since quitting: 21.4  . Smokeless tobacco: Never Used  Vaping Use  . Vaping Use: Never used  Substance Use Topics  . Alcohol use: Yes  . Drug use: Not Currently    Home Medications Prior to Admission medications   Medication Sig Start Date End Date Taking? Authorizing Provider  chlorproMAZINE (THORAZINE) 50 MG tablet Take 1 tablet (50 mg total) by mouth 2 (two) times daily as needed. 07/26/20   Tomma Lightning, MD  losartan (COZAAR) 25 MG tablet Take 25 mg by mouth daily.    [provider]  predniSONE (DELTASONE) 10 MG tablet Take 4 tabs po daily x 3 days; then 3 tabs daily x3 days; then 2 tabs daily x3 days; then 1 tab daily x 3 days; then stop 07/23/20   Glenford Bayley, NP  promethazine-dextromethorphan (PROMETHAZINE-DM) 6.25-15 MG/5ML syrup Take 5 mLs by mouth 3 (three) times daily as needed for cough. 07/19/20   Bing Neighbors, FNP  tadalafil (CIALIS) 20 MG tablet Take 20 mg by mouth daily. 06/19/20   [provider]    Allergies  Patient has no known allergies.  Review of Systems   Review of Systems  Constitutional: Negative for chills and fever.  HENT: Negative for ear pain and sore throat.   Eyes: Negative for pain and visual disturbance.  Respiratory: Positive for cough (productive of white sputum) and shortness of breath.   Cardiovascular: Positive for chest pain (intermittent). Negative for palpitations.  Gastrointestinal: Negative for abdominal pain and vomiting.  Genitourinary: Negative for dysuria and hematuria.  Musculoskeletal: Negative for arthralgias and back pain.  Skin: Negative for color change and rash.  Neurological: Negative for seizures and syncope.  All other systems reviewed and are  negative.   Physical Exam Updated Vital Signs BP 109/69 (BP Location: Right Arm)   Pulse 87   Temp 97.9 F (36.6 C) (Oral)   Resp 18   SpO2 94%   Physical Exam Vitals and nursing note reviewed.  Constitutional:      Appearance: He is well-developed.  HENT:     Head: Normocephalic and atraumatic.  Eyes:     Conjunctiva/sclera: Conjunctivae normal.  Cardiovascular:     Rate and Rhythm: Normal rate and regular rhythm.     Heart sounds: No murmur heard.   Pulmonary:     Effort: Pulmonary effort is normal. No respiratory distress.     Breath sounds: Examination of the right-upper field reveals decreased breath sounds and wheezing. Examination of the left-upper field reveals decreased breath sounds and wheezing. Examination of the right-middle field reveals decreased breath sounds. Examination of the left-middle field reveals decreased breath sounds. Examination of the right-lower field reveals decreased breath sounds. Examination of the left-lower field reveals decreased breath sounds. Decreased breath sounds and wheezing present.  Abdominal:     Palpations: Abdomen is soft.     Tenderness: There is no abdominal tenderness.  Musculoskeletal:     Cervical back: Neck supple.     Right lower leg: No edema.     Left lower leg: No edema.  Skin:    General: Skin is warm and dry.  Neurological:     Mental Status: He is alert.     ED Results / Procedures / Treatments   Labs (all labs ordered are listed, but only abnormal results are displayed) Labs Reviewed  CBC WITH DIFFERENTIAL/PLATELET - Abnormal; Notable for the following components:      Result Value   WBC 15.8 (*)    Neutro Abs 12.5 (*)    Monocytes Absolute 1.3 (*)    Abs Immature Granulocytes 0.10 (*)    All other components within normal limits  D-DIMER, QUANTITATIVE - Abnormal; Notable for the following components:   D-Dimer, Quant 1.59 (*)    All other components within normal limits  COMPREHENSIVE METABOLIC  PANEL - Abnormal; Notable for the following components:   Sodium 134 (*)    Glucose, Bld 120 (*)    Albumin 3.3 (*)    All other components within normal limits  BLOOD GAS, ARTERIAL - Abnormal; Notable for the following components:   pO2, Arterial 56.4 (*)    All other components within normal limits  RESP PANEL BY RT-PCR (FLU A&B, COVID) ARPGX2  BRAIN NATRIURETIC PEPTIDE  LIPASE, BLOOD  TROPONIN I (HIGH SENSITIVITY)  TROPONIN I (HIGH SENSITIVITY)    EKG EKG Interpretation  Date/Time:  Sunday 2020-08-09 16:46:10 EDT Ventricular Rate:  84 PR Interval:  148 QRS Duration: 87 QT Interval:  367 QTC Calculation: 434 R Axis:   -24 Text Interpretation: Sinus rhythm Borderline left  axis deviation normal axis No acute ischemia Confirmed by Pieter PartridgeWright, Anayelli Lai ([id number]) on 08/01/2020 4:59:28 PM   Radiology CT Angio Chest PE W and/or Wo Contrast  Result Date: 08/05/2020 CLINICAL DATA:  Pneumonia, hypoxia, cough, elevated D-dimer EXAM: CT ANGIOGRAPHY CHEST WITH CONTRAST TECHNIQUE: Multidetector CT imaging of the chest was performed using the standard protocol during bolus administration of intravenous contrast. Multiplanar CT image reconstructions and MIPs were obtained to evaluate the vascular anatomy. CONTRAST:  80mL OMNIPAQUE IOHEXOL 350 MG/ML SOLN COMPARISON:  None. FINDINGS: Cardiovascular: The examination is markedly limited by motion artifact as the patient coughed during the initial scan. Subsequent imaging was performed essentially during venous phase of enhancement. As result, the examination is adequate only for exclusion of intraluminal filling defect within the main and central right and left pulmonary arteries of which there is none. The pulmonary arterial vasculature distally is inadequately imaged to definitively exclude the presence of small intraluminal filling defect. The central pulmonary arteries are of normal caliber. Extensive coronary artery calcification. Global cardiac size within  normal limits. No pericardial effusion. The thoracic aorta is of normal caliber. Mild atherosclerotic calcification noted within the a thoracic aorta. Mediastinum/Nodes: There is shotty mediastinal and bilateral hilar adenopathy, possibly reactive in nature. No frankly pathologically enlarged lymph nodes are identified within the thorax, however. Small hiatal hernia. Esophagus unremarkable. The visualized thyroid is unremarkable. Lungs/Pleura: Lung volumes are small, but are symmetric. There is extensive bilateral ground-glass pulmonary infiltrate and patchy consolidation demonstrating a peripheral and mid lung zone predominance with traction bronchiolectasis, septal thickening best appreciated within the lung bases, and subtle subpleural fibrosis. The findings are suggestive of acute to organizing phase of diffuse alveolar damage and can be seen in subacute atypical infection, including viral pneumonia such as COVID 19 pneumonia, drug reaction, and connective tissue disorders. No pneumothorax or pleural effusion. The central airways are widely patent. Upper Abdomen: No acute abnormality. Musculoskeletal: No acute bone abnormality. No lytic or blastic bone lesions. Review of the MIP images confirms the above findings. IMPRESSION: Widespread pulmonary parenchymal abnormality most suggestive of subacute diffuse lung injury/diffuse alveolar damage. Differential considerations are as listed above. Serologic correlation for COVID-19 pneumonia may be helpful for further management. Technically limited examination without evidence of large central pulmonary embolism. Extensive coronary artery calcification Aortic Atherosclerosis (ICD10-I70.0). Electronically Signed   By: Helyn NumbersAshesh  Parikh MD   On: 08/16/2020 19:48   DG Chest Port 1 View  Result Date: 08/28/2020 CLINICAL DATA:  Recent pneumonia, was on home oxygen and ran out of oxygen this afternoon, oxygen desaturation EXAM: PORTABLE CHEST 1 VIEW COMPARISON:  Portable  exam 1608 hours compared to 07/19/2020 FINDINGS: Upper normal size of cardiac silhouette. Slightly rotated to the RIGHT. Extensive BILATERAL pulmonary infiltrates, probably slightly increased. No pleural effusion or pneumothorax. No acute osseous findings. IMPRESSION: Diffuse BILATERAL pulmonary infiltrates consistent with multifocal pneumonia, probably slightly increased. Electronically Signed   By: Ulyses SouthwardMark  Boles M.D.   On: 08/29/2020 16:19    Procedures Procedures   Medications Ordered in ED Medications  ipratropium-albuterol (DUONEB) 0.5-2.5 (3) MG/3ML nebulizer solution 3 mL (3 mLs Nebulization Given 08/01/2020 1651)  iohexol (OMNIPAQUE) 350 MG/ML injection 80 mL (80 mLs Intravenous Contrast Given 08/28/2020 1821)    ED Course  I have reviewed the triage vital signs and the nursing notes.  Pertinent labs & imaging results that were available during my care of the patient were reviewed by me and considered in my medical decision making (see chart for details).  Clinical Course  as of 08/23/2020 2027  Sun Aug 04, 2020  2021 I spoke with Dr. Cyndia Bent with [facility]. [AW]    Clinical Course User Index [AW] Koleen Distance, MD   MDM Rules/Calculators/A&P                          Sima Matas presented with hypoxia in the setting of multifocal pneumonia that has been resistant to outpatient medication.  He has tested negative for COVID-19 today, but that is still on the differential.  He was evaluated for evidence of ACS or PE.  Since he is still requiring significant amount of oxygen and his pneumonia has progressed, he will be admitted to the hospital for further management. Final Clinical Impression(s) / ED Diagnoses Final diagnoses:  Recurrent pneumonia  Acute respiratory failure with hypoxia [facility])    Rx / DC Orders ED Discharge Orders    None       Koleen Distance, MD 08/06/2020 2044

## 2020-08-04 NOTE — ED Notes (Signed)
I gave patient a cup of water per MD Delford Field

## 2020-08-04 NOTE — ED Triage Notes (Signed)
EMS reports from home, recent Dx of PNA, sent home with 2lts nasal cannula. Ran out of 02 this afternoon, and unable to acquire. Initial SAT 72 on scene. Placed on 4lts on arrival brought SAT up to 94%. Spanish speaker, limited English. Family at bedside.  BP 104/68 HR 92 RR 18 Sp02 94 @ 4 lts nasal cannula Temp 97.2

## 2020-08-05 DIAGNOSIS — Z515 Encounter for palliative care: Secondary | ICD-10-CM | POA: Diagnosis not present

## 2020-08-05 DIAGNOSIS — I10 Essential (primary) hypertension: Secondary | ICD-10-CM | POA: Diagnosis present

## 2020-08-05 DIAGNOSIS — J479 Bronchiectasis, uncomplicated: Secondary | ICD-10-CM | POA: Diagnosis present

## 2020-08-05 DIAGNOSIS — J9621 Acute and chronic respiratory failure with hypoxia: Secondary | ICD-10-CM

## 2020-08-05 DIAGNOSIS — J80 Acute respiratory distress syndrome: Secondary | ICD-10-CM | POA: Diagnosis present

## 2020-08-05 DIAGNOSIS — R739 Hyperglycemia, unspecified: Secondary | ICD-10-CM | POA: Diagnosis not present

## 2020-08-05 DIAGNOSIS — J84112 Idiopathic pulmonary fibrosis: Secondary | ICD-10-CM | POA: Diagnosis present

## 2020-08-05 DIAGNOSIS — T380X5A Adverse effect of glucocorticoids and synthetic analogues, initial encounter: Secondary | ICD-10-CM | POA: Diagnosis not present

## 2020-08-05 DIAGNOSIS — R0603 Acute respiratory distress: Secondary | ICD-10-CM | POA: Diagnosis not present

## 2020-08-05 DIAGNOSIS — R0902 Hypoxemia: Secondary | ICD-10-CM | POA: Diagnosis not present

## 2020-08-05 DIAGNOSIS — G4733 Obstructive sleep apnea (adult) (pediatric): Secondary | ICD-10-CM | POA: Diagnosis present

## 2020-08-05 DIAGNOSIS — Z66 Do not resuscitate: Secondary | ICD-10-CM | POA: Diagnosis not present

## 2020-08-05 DIAGNOSIS — Z7189 Other specified counseling: Secondary | ICD-10-CM | POA: Diagnosis not present

## 2020-08-05 DIAGNOSIS — Z0184 Encounter for antibody response examination: Secondary | ICD-10-CM | POA: Diagnosis not present

## 2020-08-05 DIAGNOSIS — J9601 Acute respiratory failure with hypoxia: Secondary | ICD-10-CM | POA: Diagnosis not present

## 2020-08-05 DIAGNOSIS — F418 Other specified anxiety disorders: Secondary | ICD-10-CM | POA: Diagnosis not present

## 2020-08-05 DIAGNOSIS — E669 Obesity, unspecified: Secondary | ICD-10-CM | POA: Diagnosis present

## 2020-08-05 DIAGNOSIS — J189 Pneumonia, unspecified organism: Secondary | ICD-10-CM | POA: Diagnosis not present

## 2020-08-05 DIAGNOSIS — E1165 Type 2 diabetes mellitus with hyperglycemia: Secondary | ICD-10-CM | POA: Diagnosis not present

## 2020-08-05 DIAGNOSIS — Z9981 Dependence on supplemental oxygen: Secondary | ICD-10-CM | POA: Diagnosis not present

## 2020-08-05 DIAGNOSIS — Z8701 Personal history of pneumonia (recurrent): Secondary | ICD-10-CM | POA: Diagnosis not present

## 2020-08-05 DIAGNOSIS — H0520 Unspecified exophthalmos: Secondary | ICD-10-CM | POA: Diagnosis present

## 2020-08-05 DIAGNOSIS — Z87891 Personal history of nicotine dependence: Secondary | ICD-10-CM | POA: Diagnosis not present

## 2020-08-05 DIAGNOSIS — Z6830 Body mass index (BMI) 30.0-30.9, adult: Secondary | ICD-10-CM | POA: Diagnosis not present

## 2020-08-05 DIAGNOSIS — Z20822 Contact with and (suspected) exposure to covid-19: Secondary | ICD-10-CM | POA: Diagnosis present

## 2020-08-05 LAB — RESPIRATORY PANEL BY PCR

## 2020-08-05 LAB — C-REACTIVE PROTEIN: CRP: 15.3 mg/dL — ABNORMAL HIGH (ref <1.0)

## 2020-08-05 LAB — BASIC METABOLIC PANEL
Anion gap: 7 (ref 5–15)
BUN: 20 mg/dL (ref 8–23)
CO2: 26 mmol/L (ref 22–32)
Calcium: 9.3 mg/dL (ref 8.9–10.3)
Chloride: 101 mmol/L (ref 98–111)
Creatinine, Ser: 1.04 mg/dL (ref 0.61–1.24)
GFR, Estimated: >60 mL/min (ref >60)
Glucose, Bld: 278 mg/dL — ABNORMAL HIGH (ref 70–99)
Potassium: 4.7 mmol/L (ref 3.5–5.1)
Sodium: 134 mmol/L — ABNORMAL LOW (ref 135–145)

## 2020-08-05 LAB — CBC
HCT: 42.1 % (ref 39.0–52.0)
Hemoglobin: 13.6 g/dL (ref 13.0–17.0)
MCH: 29.2 pg (ref 26.0–34.0)
MCHC: 32.3 g/dL (ref 30.0–36.0)
MCV: 90.3 fL (ref 80.0–100.0)
Platelets: 214 10*3/uL (ref 150–400)
RBC: 4.66 MIL/uL (ref 4.22–5.81)
RDW: 14.9 % (ref 11.5–15.5)
WBC: 15 10*3/uL — ABNORMAL HIGH (ref 4.0–10.5)
nRBC: 0 % (ref 0.0–0.2)

## 2020-08-05 LAB — SEDIMENTATION RATE: Sed Rate: 36 mm/hr — ABNORMAL HIGH (ref 0–16)

## 2020-08-05 MED ORDER — DM-GUAIFENESIN ER 30-600 MG PO TB12
1.0000 | ORAL_TABLET | Freq: Two times a day (BID) | ORAL | Status: DC
  Administered 2020-08-05 – 2020-08-09 (×8): 1 via ORAL
  Filled 2020-08-05 (×10): qty 1

## 2020-08-05 MED ORDER — BUDESONIDE 0.25 MG/2ML IN SUSP
0.2500 mg | Freq: Two times a day (BID) | RESPIRATORY_TRACT | Status: DC
  Administered 2020-08-05 – 2020-08-06 (×3): 0.25 mg via RESPIRATORY_TRACT
  Filled 2020-08-05 (×3): qty 2

## 2020-08-05 MED ORDER — METHYLPREDNISOLONE SODIUM SUCC 40 MG IJ SOLR
40.0000 mg | Freq: Two times a day (BID) | INTRAMUSCULAR | Status: DC
  Administered 2020-08-05 – 2020-08-10 (×11): 40 mg via INTRAVENOUS
  Filled 2020-08-05 (×11): qty 1

## 2020-08-05 NOTE — Hospital Course (Signed)
Per H&P by Dr. Cyndia Bent: "Thomas Glass is a 65 y.o. male with medical history significant for  HTN, OSA not yet on CPAP who presents with progressive worsening shortness of breath and cough.   Patient began to have symptoms around the middle of May with increasing shortness of breath with exertion and cough productive of white sputum.  He was first evaluated by his PCP and started on prednisone and azithromycin. Reportedly had negative COVID test.  However he did not have any improvement and presented to urgent care on 5/20.  He was noted to be hypoxic down to 80% on room air which improved following albuterol and IM Decadron.  Had negative COVID test again.  He was given a course of cefdinir.  Then on 5/24 he was seen by pulmonology and was started on 2 L of oxygen to use with exertion and at night.  Also given Mucinex, albuterol and IM steroids.  However he continued to have worsening symptoms and had a repeat follow-up with pulmonology on 5/27 where his oxygen was increased to 4 L. For the past several days he had to use O2 Continuously and could not walk several feet without shortness of breath.  Had an episode of chills about 2 days ago but did not take temperature.  Also had 1 episode of chest pain yesterday for several minutes when laying down.  He has also not been able to lay flat and has to sleep in a reclined.  Denies any lower extremity edema. Only used albuterol inhaler once today.   No recent travel. 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 Work is strict about wearing mask and denies coworkers with similar symptoms.  He endorses occasional tobacco use for many years but usually only smokes during gathering.  He reports a history of pneumonia about 30 years ago where he required mechanical ventilation.  Denies any family history of any pulmonary diseases.   He was recently diagnosed with OSA several months ago and is working with pulm to get his CPAP.   ED Course: He was  afrebile with borderline blood pressure of 102/58. EMS noted pt to be 77% with O2.Now on 4L.  WBC of 15.8.  Sodium 134.  BG 120.  ABG reassuring.  CTA chest significant motion artifacts so PE study was limited but he was noted to have diffuse alveolar damage typical with atypical infection, drug reaction versus connective tissue disorder."

## 2020-08-05 NOTE — Consult Note (Addendum)
NAME:  Thomas Glass, MRN:  [id number], DOB:  Dec 10, 1955, LOS: 0 ADMISSION DATE:  08/12/2020, CONSULTATION DATE:  08/05/20 REFERRING MD:  Denton Lank, CHIEF COMPLAINT:  Shortness of breath    History of Present Illness:  65 y.o. M with PMH of HTN and remote tobacco use along with hospitalization for PNA >49yrs ago who was in his usual state of health until approximately 6 months ago when he began to develop shortness of breath, seen at pulmonary office with concern for OSA, did not start CPAP.   Then over the last two weeks developed progressive productive cough and exertional dyspnea.   He was seen in urgent care and PCP and prescribed Azithromycin, nebs and steroids without relief.  Cefdenir was added and pt still had persistent shortness of breath.   He followed up with pulmonology, oxygen was 82% on RA at the time and was send home on 2L home O2 and given steroid injection.  All Covid-19 tests have been negative.   Followed with pulm on 5/27 and plan to start CPAP and increase home O2 to 4L.   However over the last week pt's wife has noted increasing cough and shortness of breath to the point that he cannot walk short distances, so called EMS.    They report decreased appetite and chills, no measured fever.   In the ED, pt was initially hypoxic to 72% after running out of his home oxygen.   Oxygen improved with nasal cannula, work-up significant for WBC 15k.  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  Pt was admitted to the hospitalists and PCCM consulted.    Pt reports that he smoked 1-2 cigarettes per day, but quit over 62yrs ago.  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  No significant family history of lung disease and no known allergies, denies new pets or known mold exposure.    Pt speaks some English, seen with wife who felt comfortable translating as  needed.    Pertinent  Medical History   has a past medical history of Hypertension.   Significant Hospital Events: Including procedures, antibiotic start and stop dates in addition to other pertinent events   . 6/5 Admit to hospitalists . 6/6 increasing O2 requirement, PCCM consult    Micro: 6/6 Respiratory viral panel>>negative 6/5 Covid-19>>negative 6/5 Sputum culture>>   Antibiotics:     Interim History / Subjective:    Pt on 8L [place] today and de-saturated into 70%'s when up to the restroom with significant dyspnea and tachypnea  Objective   Blood pressure (!) 87/75, pulse 73, temperature 98 F (36.7 C), temperature source Oral, resp. rate 17, SpO2 93 %.       No intake or output data in the 24 hours ending 08/05/20 1324 There were no vitals filed for this visit.  General:  Well-nourished M, acutely dyspneic on evaluation HEENT: MM pink/moist, nasal cannula in place Neuro: awake and alert, conversational and moving all extremities CV: s1s2 rrr, no m/r/g PULM:  Dyspneic and tachypnic with accessory muscle use after walking, improved with rest, O2 increased to 10L GI: soft, bsx4 active  Extremities: warm/dry, no edema  Skin: no rashes or lesions   Labs/imaging that I havepersonally reviewed  (right click and "Reselect all SmartList Selections" daily)    CBC-WBC 15k BMP Sed rate-36 ANCA pending CRP 15.3   Resolved Hospital Problem list  Assessment & Plan:    Acute on Chronic Hypoxic Respiratory Failure CT with diffuse alveolar damage, no known Covid-19 infection Treated with multiple courses of antibiotics and steroids without improvemt CRP elevated, sed rate mildly up, ANCA titers pending P: -Viral panel negative  -Continue supplemental oxygen, may need HFNC, monitor closely for need for intubation or transfer to ICU -bed rest -Agree with solumedrol, duonebs and mucinex -At this point, doubt acute bacterial infection, check  procalcitonin -follow sputum culture,  -check HIV and Immunoglobulins -ANCA pending -Will likely benefit from bronchoscopy, though would need intubated and likely stay in ICU.  Pt discussed with wife and would like to hold off on this for right now and monitor response to steroids     Best practice (right click and "Reselect all SmartList Selections" daily)  Per primary  Labs   CBC: Recent Labs  Lab 08/16/2020 1602 08/05/20 0439  WBC 15.8* 15.0*  NEUTROABS 12.5*  --   HGB 13.9 13.6  HCT 42.3 42.1  MCV 89.4 90.3  PLT 218 214    Basic Metabolic Panel: Recent Labs  Lab 08/29/2020 1724 08/05/20 0439  NA 134* 134*  K 4.3 4.7  CL 101 101  CO2 26 26  GLUCOSE 120* 278*  BUN 18 20  CREATININE 0.96 1.04  CALCIUM 8.9 9.3   GFR: Estimated Creatinine Clearance: 67.4 mL/min (by C-G formula based on SCr of 1.04 mg/dL). Recent Labs  Lab 08/06/2020 1602 08/18/2020 1724 08/05/20 0439  PROCALCITON  --  <0.10  --   WBC 15.8*  --  15.0*    Liver Function Tests: Recent Labs  Lab 08/12/2020 1724  AST 20  ALT 19  ALKPHOS 64  BILITOT 0.9  PROT 7.3  ALBUMIN 3.3*   Recent Labs  Lab 08/14/2020 1724  LIPASE 27   No results for input(s): AMMONIA in the last 168 hours.  ABG    Component Value Date/Time   PHART 7.435 08/21/2020 1856   PCO2ART 36.6 08/14/2020 1856   PO2ART 56.4 (L) 08/24/2020 1856   HCO3 24.1 08/29/2020 1856   O2SAT 89.2 08/25/2020 1856     Coagulation Profile: No results for input(s): INR, PROTIME in the last 168 hours.  Cardiac Enzymes: No results for input(s): CKTOTAL, CKMB, CKMBINDEX, TROPONINI in the last 168 hours.  HbA1C: No results found for: HGBA1C  CBG: No results for input(s): GLUCAP in the last 168 hours.  Review of Systems:   Review of Systems  Constitutional: Positive for chills, malaise/fatigue and weight loss. Negative for fever.  Respiratory: Positive for cough, sputum production and shortness of breath. Negative for hemoptysis.    Cardiovascular: Negative for chest pain and leg swelling.     Past Medical History:  He,  has a past medical history of Hypertension.   Surgical History:  History reviewed. No pertinent surgical history.   Social History:   reports that he quit smoking about 21 years ago. His smoking use included cigarettes. He has a 5.00 pack-year smoking history. He has never used smokeless tobacco. He reports current alcohol use. He reports previous drug use.   Family History:  His family history is not on file.   Allergies No Known Allergies   Home Medications  Prior to Admission medications   Medication Sig Start Date End Date Taking? Authorizing Provider  albuterol (VENTOLIN HFA) 108 (90 Base) MCG/ACT inhaler Inhale 2 puffs into the lungs every 6 (six) hours as needed for wheezing or shortness of breath.   Yes [provider]  Ascorbic Acid (VITAMIN C PO) Take 1 tablet by mouth daily.   Yes [provider]  cefdinir (OMNICEF) 300 MG capsule Take 300 mg by mouth 2 (two) times daily.   Yes [provider]  Cyanocobalamin (VITAMIN B-12 PO) Take 1 tablet by mouth daily.   Yes [provider]  ibuprofen (ADVIL) 200 MG tablet Take 400 mg by mouth every 6 (six) hours as needed for fever.   Yes [provider]  losartan (COZAAR) 25 MG tablet Take 25 mg by mouth daily.   Yes [provider]  MAGNESIUM PO Take 1 tablet by mouth daily.   Yes [provider]  OXYGEN Inhale 2 L into the lungs continuous.   Yes [provider]  predniSONE (DELTASONE) 10 MG tablet Take 4 tabs po daily x 3 days; then 3 tabs daily x3 days; then 2 tabs daily x3 days; then 1 tab daily x 3 days; then stop Patient taking differently: Take 10-40 mg by mouth See admin instructions. Take 4 tabs (40 mg) by mouth po daily x 3 days; then 3 tabs (30 mg) daily x3 days; then 2 tabs  (20 mg) daily x3 days; then 1 tab  (10 mg) daily x 3 days; then stop 07/23/20  Yes Glenford Bayley, NP  azithromycin (ZITHROMAX) 250 MG tablet Take 250-500 mg by mouth See admin instructions. Take 2 tablets (500 mg) by mouth 1st day, then take 1 tablet (250 mg) daily on days 2-5 07/18/20   [provider]  chlorproMAZINE (THORAZINE) 50 MG tablet Take 1 tablet (50 mg total) by mouth 2 (two) times daily as needed. Patient not taking: No sig reported 07/26/20   Tomma Lightning, MD  promethazine-dextromethorphan (PROMETHAZINE-DM) 6.25-15 MG/5ML syrup Take 5 mLs by mouth 3 (three) times daily as needed for cough. Patient not taking: Reported on 08/22/2020 07/19/20   Bing Neighbors, FNP     Critical care time: n/a     Darcella Gasman Marketa Midkiff, PA-C [facility] See Amion for pager If no response to pager , please call [phone] until 7pm After 7:00 pm call Elink  [phone]

## 2020-08-05 NOTE — Progress Notes (Signed)
PROGRESS NOTE    Thomas Glass   NWG:[id number]  DOB: 1955-04-21  PCP: Norm Salt, PA    DOA: Sep 02, 2020 LOS: 0   Assessment & Plan   Principal Problem:   Acute on chronic respiratory failure with hypoxia (HCC) Active Problems:   OSA (obstructive sleep apnea)   HTN (hypertension)   Acute on chronic respiratory failure with hypoxia Etiology of this is to be determined but seems very unlikely infectious at this point.  No known COVID-19 infection history.  CT scan showing diffuse alveolar damage. Viral panel is negative. --Pulmonology is following, appreciate recommendations. --Continue IV steroids, nebs and Mucinex -- Follow-up sputum culture -- Pro-Cal is pending --Follow-up ANCA titers, ACE -- Monitor respiratory status very closely, patient may require heated HFNC and hopefully not intubation. Low threshold to transfer to stepdown unit.  Essential hypertension -continue home losartan  Obstructive sleep apnea -CPAP had been recommended but not yet started.  Following with pulmonology.  Patient BMI: There is no height or weight on file to calculate BMI.   DVT prophylaxis: enoxaparin (LOVENOX) injection 40 mg Start: Sep 02, 2020 2200   Diet:  Diet Orders (From admission, onward)    Start     Ordered   Sep 02, 2020 2115  Diet Heart Room service appropriate? Yes; Fluid consistency: Thin  Diet effective now       Question Answer Comment  Room service appropriate? Yes   Fluid consistency: Thin      09-02-20 2115            Code Status: Full Code   Brief Narrative / Hospital Course to Date:   Per H&P by Dr. Cyndia Bent: "Thomas Glass is a 65 y.o. male with medical history significant for  HTN, OSA not yet on CPAP who presents with progressive worsening shortness of breath and cough.   Patient began to have symptoms around the middle of May with increasing shortness of breath with exertion and cough productive of white sputum.  He was first evaluated by his PCP and  started on prednisone and azithromycin. Reportedly had negative COVID test.  However he did not have any improvement and presented to urgent care on 5/20.  He was noted to be hypoxic down to 80% on room air which improved following albuterol and IM Decadron.  Had negative COVID test again.  He was given a course of cefdinir.  Then on 5/24 he was seen by pulmonology and was started on 2 L of oxygen to use with exertion and at night.  Also given Mucinex, albuterol and IM steroids.  However he continued to have worsening symptoms and had a repeat follow-up with pulmonology on 5/27 where his oxygen was increased to 4 L. For the past several days he had to use O2 Continuously and could not walk several feet without shortness of breath.  Had an episode of chills about 2 days ago but did not take temperature.  Also had 1 episode of chest pain yesterday for several minutes when laying down.  He has also not been able to lay flat and has to sleep in a reclined.  Denies any lower extremity edema. Only used albuterol inhaler once today.   No recent travel. 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 Work is strict about wearing mask and denies coworkers with similar symptoms.  He endorses occasional tobacco use for many years but usually only smokes during gathering.  He reports a history of pneumonia about 30 years ago where he  required mechanical ventilation.  Denies any family history of any pulmonary diseases.   He was recently diagnosed with OSA several months ago and is working with pulm to get his CPAP.   ED Course: He was afrebile with borderline blood pressure of 102/58. EMS noted pt to be 77% with O2.Now on 4L.  WBC of 15.8.  Sodium 134.  BG 120.  ABG reassuring.  CTA chest significant motion artifacts so PE study was limited but he was noted to have diffuse alveolar damage typical with atypical infection, drug reaction versus connective tissue disorder."    Subjective 08/05/20     Patient seen this morning with wife at bedside.  RT had just been in the room we discussed in the hallway patient has significant O2 desaturations with any exertion.  Patient otherwise reports feeling okay with no fevers or chills.  No chest pain, abdominal pain nausea vomiting or other acute complaints.  He would like to take a shower but concern for worsening his respiratory status   Disposition Plan & Communication   Status is: Inpatient  Remains inpatient appropriate because:IV treatments appropriate due to intensity of illness or inability to take PO   Dispo: The patient is from: Home              Anticipated d/c is to: Home              Patient currently is not medically stable to d/c.   Difficult to place patient No  Family Communication: Wife at bedside on rounds today 6/6    Consults, Procedures, Significant Events   Consultants:   Pulmonology [facility]  Procedures:   None  Antimicrobials:  Anti-infectives (From admission, onward)   None        Micro    Objective   Vitals:   08/05/20 0949 08/05/20 1154 08/05/20 1337 08/05/20 1430  BP: (!) 87/75  109/67   Pulse: 73  87   Resp: 17  20   Temp: 98 F (36.7 C)  98 F (36.7 C)   TempSrc: Oral  Oral   SpO2: 94% 93% 93% 92%    Intake/Output Summary (Last 24 hours) at 08/05/2020 1709 Last data filed at 08/05/2020 1341 Gross per 24 hour  Intake 320 ml  Output --  Net 320 ml   There were no vitals filed for this visit.  Physical Exam:  General exam: awake, alert, no acute distress, ill-appearing HEENT: clear conjunctiva, anicteric sclera, moist mucus membranes, hearing grossly normal  Respiratory system: Diffuse rhonchi, no wheezes, shallow inspirations, mildly increased respiratory effort.  On 10 L/min supplemental oxygen Cardiovascular system: normal S1/S2, RRR, no pedal edema.   Central nervous system: A&O x3. no gross focal neurologic deficits, normal speech Extremities: moves all, no cyanosis, normal  tone Psychiatry: normal mood, congruent affect, judgement and insight appear normal  Labs   Data Reviewed: I have personally reviewed following labs and imaging studies  CBC: Recent Labs  Lab August 06, 2020 1602 08/05/20 0439  WBC 15.8* 15.0*  NEUTROABS 12.5*  --   HGB 13.9 13.6  HCT 42.3 42.1  MCV 89.4 90.3  PLT 218 214   Basic Metabolic Panel: Recent Labs  Lab 06-Aug-2020 1724 08/05/20 0439  NA 134* 134*  K 4.3 4.7  CL 101 101  CO2 26 26  GLUCOSE 120* 278*  BUN 18 20  CREATININE 0.96 1.04  CALCIUM 8.9 9.3   GFR: Estimated Creatinine Clearance: 67.4 mL/min (by C-G formula based on SCr of 1.04 mg/dL).  Liver Function Tests: Recent Labs  Lab 08-21-2020 1724  AST 20  ALT 19  ALKPHOS 64  BILITOT 0.9  PROT 7.3  ALBUMIN 3.3*   Recent Labs  Lab 21-Aug-2020 1724  LIPASE 27   No results for input(s): AMMONIA in the last 168 hours. Coagulation Profile: No results for input(s): INR, PROTIME in the last 168 hours. Cardiac Enzymes: No results for input(s): CKTOTAL, CKMB, CKMBINDEX, TROPONINI in the last 168 hours. BNP (last 3 results) No results for input(s): PROBNP in the last 8760 hours. HbA1C: No results for input(s): HGBA1C in the last 72 hours. CBG: No results for input(s): GLUCAP in the last 168 hours. Lipid Profile: No results for input(s): CHOL, HDL, LDLCALC, TRIG, CHOLHDL, LDLDIRECT in the last 72 hours. Thyroid Function Tests: No results for input(s): TSH, T4TOTAL, FREET4, T3FREE, THYROIDAB in the last 72 hours. Anemia Panel: No results for input(s): VITAMINB12, FOLATE, FERRITIN, TIBC, IRON, RETICCTPCT in the last 72 hours. Sepsis Labs: Recent Labs  Lab 2020-08-21 1724  PROCALCITON <0.10    Recent Results (from the past 240 hour(s))  Resp Panel by RT-PCR (Flu A&B, Covid) Nasopharyngeal Swab     Status: None   Collection Time: Aug 21, 2020  4:03 PM   Specimen: Nasopharyngeal Swab; Nasopharyngeal(NP) swabs in vial transport medium  Result Value Ref Range Status    SARS Coronavirus 2 by RT PCR NEGATIVE NEGATIVE Final    Comment: (NOTE) SARS-CoV-2 target nucleic acids are NOT DETECTED.  The SARS-CoV-2 RNA is generally detectable in upper respiratory specimens during the acute phase of infection. The lowest concentration of SARS-CoV-2 viral copies this assay can detect is 138 copies/mL. A negative result does not preclude SARS-Cov-2 infection and should not be used as the sole basis for treatment or other patient management decisions. A negative result may occur with  improper specimen collection/handling, submission of specimen other than nasopharyngeal swab, presence of viral mutation(s) within the areas targeted by this assay, and inadequate number of viral copies(<138 copies/mL). A negative result must be combined with clinical observations, patient history, and epidemiological information. The expected result is Negative.  Fact Sheet for Patients:  BloggerCourse.com  Fact Sheet for Healthcare Providers:  SeriousBroker.it  This test is no t yet approved or cleared by the [place] FDA and  has been authorized for detection and/or diagnosis of SARS-CoV-2 by FDA under an Emergency Use Authorization (EUA). This EUA will remain  in effect (meaning this test can be used) for the duration of the COVID-19 declaration under Section 564(b)(1) of the Act, 21 U.S.C.section 360bbb-3(b)(1), unless the authorization is terminated  or revoked sooner.       Influenza A by PCR NEGATIVE NEGATIVE Final   Influenza B by PCR NEGATIVE NEGATIVE Final    Comment: (NOTE) The Xpert Xpress SARS-CoV-2/FLU/RSV plus assay is intended as an aid in the diagnosis of influenza from Nasopharyngeal swab specimens and should not be used as a sole basis for treatment. Nasal washings and aspirates are unacceptable for Xpert Xpress SARS-CoV-2/FLU/RSV testing.  Fact Sheet for  Patients: BloggerCourse.com  Fact Sheet for Healthcare Providers: SeriousBroker.it  This test is not yet approved or cleared by the [place] FDA and has been authorized for detection and/or diagnosis of SARS-CoV-2 by FDA under an Emergency Use Authorization (EUA). This EUA will remain in effect (meaning this test can be used) for the duration of the COVID-19 declaration under Section 564(b)(1) of the Act, 21 U.S.C. section 360bbb-3(b)(1), unless the authorization is terminated or revoked.  Performed  at [facility], [address] [place], [place] [address]   Respiratory (~20 pathogens) panel by PCR     Status: None   Collection Time: 08/05/20 12:40 AM   Specimen: Nasopharyngeal Swab; Respiratory  Result Value Ref Range Status   Adenovirus NOT DETECTED NOT DETECTED Final   Coronavirus 229E NOT DETECTED NOT DETECTED Final    Comment: (NOTE) The Coronavirus on the Respiratory Panel, DOES NOT test for the novel  Coronavirus (2019 nCoV)    Coronavirus HKU1 NOT DETECTED NOT DETECTED Final   Coronavirus NL63 NOT DETECTED NOT DETECTED Final   Coronavirus OC43 NOT DETECTED NOT DETECTED Final   Metapneumovirus NOT DETECTED NOT DETECTED Final   Rhinovirus / Enterovirus NOT DETECTED NOT DETECTED Final   Influenza A NOT DETECTED NOT DETECTED Final   Influenza B NOT DETECTED NOT DETECTED Final   Parainfluenza Virus 1 NOT DETECTED NOT DETECTED Final   Parainfluenza Virus 2 NOT DETECTED NOT DETECTED Final   Parainfluenza Virus 3 NOT DETECTED NOT DETECTED Final   Parainfluenza Virus 4 NOT DETECTED NOT DETECTED Final   Respiratory Syncytial Virus NOT DETECTED NOT DETECTED Final   Bordetella pertussis NOT DETECTED NOT DETECTED Final   Bordetella Parapertussis NOT DETECTED NOT DETECTED Final   Chlamydophila pneumoniae NOT DETECTED NOT DETECTED Final   Mycoplasma pneumoniae NOT DETECTED NOT DETECTED Final    Comment:  Performed at [facility], [address] [place], [place] [address]      Imaging Studies   CT Angio Chest PE W and/or Wo Contrast  Result Date: 08/16/2020 CLINICAL DATA:  Pneumonia, hypoxia, cough, elevated D-dimer EXAM: CT ANGIOGRAPHY CHEST WITH CONTRAST TECHNIQUE: Multidetector CT imaging of the chest was performed using the standard protocol during bolus administration of intravenous contrast. Multiplanar CT image reconstructions and MIPs were obtained to evaluate the vascular anatomy. CONTRAST:  80mL OMNIPAQUE IOHEXOL 350 MG/ML SOLN COMPARISON:  None. FINDINGS: Cardiovascular: The examination is markedly limited by motion artifact as the patient coughed during the initial scan. Subsequent imaging was performed essentially during venous phase of enhancement. As result, the examination is adequate only for exclusion of intraluminal filling defect within the main and central right and left pulmonary arteries of which there is none. The pulmonary arterial vasculature distally is inadequately imaged to definitively exclude the presence of small intraluminal filling defect. The central pulmonary arteries are of normal caliber. Extensive coronary artery calcification. Global cardiac size within normal limits. No pericardial effusion. The thoracic aorta is of normal caliber. Mild atherosclerotic calcification noted within the a thoracic aorta. Mediastinum/Nodes: There is shotty mediastinal and bilateral hilar adenopathy, possibly reactive in nature. No frankly pathologically enlarged lymph nodes are identified within the thorax, however. Small hiatal hernia. Esophagus unremarkable. The visualized thyroid is unremarkable. Lungs/Pleura: Lung volumes are small, but are symmetric. There is extensive bilateral ground-glass pulmonary infiltrate and patchy consolidation demonstrating a peripheral and mid lung zone predominance with traction bronchiolectasis, septal thickening best appreciated within the lung  bases, and subtle subpleural fibrosis. The findings are suggestive of acute to organizing phase of diffuse alveolar damage and can be seen in subacute atypical infection, including viral pneumonia such as COVID 19 pneumonia, drug reaction, and connective tissue disorders. No pneumothorax or pleural effusion. The central airways are widely patent. Upper Abdomen: No acute abnormality. Musculoskeletal: No acute bone abnormality. No lytic or blastic bone lesions. Review of the MIP images confirms the above findings. IMPRESSION: Widespread pulmonary parenchymal abnormality most suggestive of subacute diffuse lung injury/diffuse  alveolar damage. Differential considerations are as listed above. Serologic correlation for COVID-19 pneumonia may be helpful for further management. Technically limited examination without evidence of large central pulmonary embolism. Extensive coronary artery calcification Aortic Atherosclerosis (ICD10-I70.0). Electronically Signed   By: Helyn Numbers MD   On: 08/27/2020 19:48   DG Chest Port 1 View  Result Date: 08/08/2020 CLINICAL DATA:  Recent pneumonia, was on home oxygen and ran out of oxygen this afternoon, oxygen desaturation EXAM: PORTABLE CHEST 1 VIEW COMPARISON:  Portable exam 1608 hours compared to 07/19/2020 FINDINGS: Upper normal size of cardiac silhouette. Slightly rotated to the RIGHT. Extensive BILATERAL pulmonary infiltrates, probably slightly increased. No pleural effusion or pneumothorax. No acute osseous findings. IMPRESSION: Diffuse BILATERAL pulmonary infiltrates consistent with multifocal pneumonia, probably slightly increased. Electronically Signed   By: Ulyses Southward M.D.   On: 08/20/2020 16:19     Medications   Scheduled Meds: . dextromethorphan-guaiFENesin  1 tablet Oral BID  . enoxaparin (LOVENOX) injection  40 mg Subcutaneous Q24H  . ipratropium-albuterol  3 mL Nebulization Q4H  . losartan  25 mg Oral Daily  . methylPREDNISolone (SOLU-MEDROL) injection   40 mg Intravenous Q12H   Continuous Infusions:     LOS: 0 days    Time spent: 30 minutes    Pennie Banter, DO [facility]  08/05/2020, 5:09 PM      If 7PM-7AM, please contact night-coverage. How to contact the [facility] Attending or Consulting provider 7A - 7P or covering provider during after hours 7P -7A, for this patient?    1. Check the care team in [facility] and look for a) attending/consulting [facility] provider listed and b) the [facility] team listed 2. Log into [email or web address] and use [facility] universal password to access. If you do not have the password, please contact the hospital operator. 3. Locate the [facility] provider you are looking for under [facility] and page to a number that you can be directly reached. 4. If you still have difficulty reaching the provider, please page the [facility] (Director on Call) for the Hospitalists listed on amion for assistance.

## 2020-08-06 LAB — CBC WITH DIFFERENTIAL/PLATELET
Abs Immature Granulocytes: 0.13 10*3/uL — ABNORMAL HIGH (ref 0.00–0.07)
Basophils Absolute: 0 10*3/uL (ref 0.0–0.1)
Basophils Relative: 0 %
Eosinophils Absolute: 0 10*3/uL (ref 0.0–0.5)
Eosinophils Relative: 0 %
HCT: 41.3 % (ref 39.0–52.0)
Hemoglobin: 13.5 g/dL (ref 13.0–17.0)
Immature Granulocytes: 1 %
Lymphocytes Relative: 4 %
Lymphs Abs: 0.9 10*3/uL (ref 0.7–4.0)
MCH: 29.2 pg (ref 26.0–34.0)
MCHC: 32.7 g/dL (ref 30.0–36.0)
MCV: 89.4 fL (ref 80.0–100.0)
Monocytes Absolute: 0.8 10*3/uL (ref 0.1–1.0)
Monocytes Relative: 4 %
Neutro Abs: 20 10*3/uL — ABNORMAL HIGH (ref 1.7–7.7)
Neutrophils Relative %: 91 %
Platelets: 217 10*3/uL (ref 150–400)
RBC: 4.62 MIL/uL (ref 4.22–5.81)
RDW: 14.6 % (ref 11.5–15.5)
WBC: 21.9 10*3/uL — ABNORMAL HIGH (ref 4.0–10.5)
nRBC: 0 % (ref 0.0–0.2)

## 2020-08-06 LAB — EXPECTORATED SPUTUM ASSESSMENT W GRAM STAIN, RFLX TO RESP C

## 2020-08-06 LAB — ANGIOTENSIN CONVERTING ENZYME: Angiotensin-Converting Enzyme: 40 U/L (ref 14–82)

## 2020-08-06 LAB — STREP PNEUMONIAE URINARY ANTIGEN: Strep Pneumo Urinary Antigen: NEGATIVE

## 2020-08-06 LAB — MRSA PCR SCREENING: MRSA by PCR: NEGATIVE

## 2020-08-06 LAB — BASIC METABOLIC PANEL
Anion gap: 9 (ref 5–15)
BUN: 16 mg/dL (ref 8–23)
CO2: 25 mmol/L (ref 22–32)
Calcium: 9.6 mg/dL (ref 8.9–10.3)
Chloride: 101 mmol/L (ref 98–111)
Creatinine, Ser: 0.94 mg/dL (ref 0.61–1.24)
GFR, Estimated: >60 mL/min (ref >60)
Glucose, Bld: 238 mg/dL — ABNORMAL HIGH (ref 70–99)
Potassium: 4.4 mmol/L (ref 3.5–5.1)
Sodium: 135 mmol/L (ref 135–145)

## 2020-08-06 LAB — HIV ANTIBODY (ROUTINE TESTING W REFLEX): HIV Screen 4th Generation wRfx: NONREACTIVE

## 2020-08-06 LAB — IGG, IGA, IGM
IgA: 359 mg/dL (ref 61–437)
IgG (Immunoglobin G), Serum: 1143 mg/dL (ref 603–1613)
IgM (Immunoglobulin M), Srm: 73 mg/dL (ref 20–172)

## 2020-08-06 LAB — GLUCOSE, CAPILLARY: Glucose-Capillary: 275 mg/dL — ABNORMAL HIGH (ref 70–99)

## 2020-08-06 MED ORDER — IPRATROPIUM-ALBUTEROL 0.5-2.5 (3) MG/3ML IN SOLN
3.0000 mL | RESPIRATORY_TRACT | Status: DC
  Administered 2020-08-06 – 2020-08-11 (×29): 3 mL via RESPIRATORY_TRACT
  Filled 2020-08-06 (×29): qty 3

## 2020-08-06 MED ORDER — CHLORHEXIDINE GLUCONATE CLOTH 2 % EX PADS
6.0000 | MEDICATED_PAD | Freq: Every day | CUTANEOUS | Status: DC
  Administered 2020-08-06 – 2020-08-11 (×5): 6 via TOPICAL

## 2020-08-06 MED ORDER — LORAZEPAM 2 MG/ML IJ SOLN: 1.0000 mg | Freq: Once | INTRAMUSCULAR | Status: DC

## 2020-08-06 MED ORDER — INSULIN ASPART 100 UNIT/ML IJ SOLN
0.0000 [IU] | Freq: Three times a day (TID) | INTRAMUSCULAR | Status: DC
  Administered 2020-08-07: 3 [IU] via SUBCUTANEOUS

## 2020-08-06 MED ORDER — ORAL CARE MOUTH RINSE
15.0000 mL | Freq: Two times a day (BID) | OROMUCOSAL | Status: DC
  Administered 2020-08-06 – 2020-08-12 (×10): 15 mL via OROMUCOSAL

## 2020-08-06 MED ORDER — HYDRALAZINE HCL 25 MG PO TABS: 25.0000 mg | ORAL_TABLET | Freq: Three times a day (TID) | ORAL | Status: DC | PRN

## 2020-08-06 MED ORDER — SODIUM CHLORIDE 0.9 % IV SOLN: INTRAVENOUS | Status: DC

## 2020-08-06 MED ORDER — LORAZEPAM 2 MG/ML IJ SOLN
0.5000 mg | Freq: Once | INTRAMUSCULAR | Status: AC
  Administered 2020-08-06: 0.5 mg via INTRAVENOUS
  Filled 2020-08-06: qty 1

## 2020-08-06 MED ORDER — ALBUTEROL SULFATE (2.5 MG/3ML) 0.083% IN NEBU
2.5000 mg | INHALATION_SOLUTION | RESPIRATORY_TRACT | Status: DC | PRN
  Administered 2020-08-10: 2.5 mg via RESPIRATORY_TRACT
  Filled 2020-08-06 (×2): qty 3

## 2020-08-06 NOTE — Consult Note (Signed)
NAME:  Thomas Glass, MRN:  [id number], DOB:  [birth year], LOS: 1 ADMISSION DATE:  08/23/2020, CONSULTATION DATE:  08/06/20 REFERRING MD:  Denton Lank, CHIEF COMPLAINT:  Shortness of breath    History of Present Illness:  65 y.o. M with PMH of HTN and remote tobacco use along with hospitalization for PNA >53yrs ago who was in his usual state of health until approximately 6 months ago when he began to develop shortness of breath, seen at pulmonary office with concern for OSA, did not start CPAP.   Then over the last two weeks developed progressive productive cough and exertional dyspnea.   He was seen in urgent care and PCP and prescribed Azithromycin, nebs and steroids without relief.  Cefdenir was added and pt still had persistent shortness of breath.   He followed up with pulmonology, oxygen was 82% on RA at the time and was send home on 2L home O2 and given steroid injection.  All Covid-19 tests have been negative.   Followed with pulm on 5/27 and plan to start CPAP and increase home O2 to 4L.   However over the last week pt's wife has noted increasing cough and shortness of breath to the point that he cannot walk short distances, so called EMS.    They report decreased appetite and chills, no measured fever.   In the ED, pt was initially hypoxic to 72% after running out of his home oxygen.   Oxygen improved with nasal cannula, work-up significant for WBC 15k.  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  Pt was admitted to the hospitalists and PCCM consulted.    Pt reports that he smoked 1-2 cigarettes per day, but quit over 65yrs ago.  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  No significant family history of lung disease and no known allergies, denies new pets or known mold exposure.    Pt speaks some English, seen with wife who felt comfortable translating as  needed.    Pertinent  Medical History   has a past medical history of Hypertension.   Significant Hospital Events: Including procedures, antibiotic start and stop dates in addition to other pertinent events   . 6/5 Admit to hospitalists . 6/6 increasing O2 requirement, PCCM consult    Micro: 6/6 Respiratory viral panel>>negative 6/5 Covid-19>>negative 6/5 Sputum culture>>   Antibiotics:     Interim History / Subjective:   No overnight events, continues to require supplemental [place] O2, though requirement down from 10L->5L  Objective   Blood pressure 98/66, pulse 70, temperature 98.3 F (36.8 C), temperature source Oral, resp. rate 20, SpO2 93 %.        Intake/Output Summary (Last 24 hours) at 08/06/2020 0938 Last data filed at 08/06/2020 0600 Gross per 24 hour  Intake 1400 ml  Output 1300 ml  Net 100 ml   There were no vitals filed for this visit.  General: Well-nourished male, resting in bed, conversational and without distress HEENT: MM pink/moist, sclera anicteric Neuro: Awake, conversational, oriented, moving all extremities CV: s1s2 RRR, no m/r/g PULM: On 5 L nasal cannula without dyspnea or retractions, bilateral basal crackles, no significant wheezing GI: soft, bsx4 active  Extremities: warm/dry, no edema  Skin: no rashes or lesions   Labs/imaging that I havepersonally reviewed  (right click and "Reselect all SmartList Selections" daily)    CBC- white blood cell count  uptrending to 20 1K BMP-glucose 238 Sed rate 36, CRP 15 Immunoglobulins within normal range HIV nonreactive   Resolved Hospital Problem list     Assessment & Plan:    Acute on Chronic Hypoxic Respiratory Failure CT with diffuse alveolar damage, no known Covid-19 infection Treated with multiple courses of antibiotics and steroids without improvemt CRP elevated, sed rate mildly up, ANCA titers pending Viral panel negative  P: -Pt feels slightly improved today, O2 requirement  improved.  He confirms that he does not want intubation for bronchoscopy P; -Monitor closely in step-down, appears comfortable today -continue steroids -await ANCA. RF, CCP ab, Anti double stranded DNA ab, glomerular basement membrane Ab -sputum culture specimen not sufficient, re-send  -Echo -low suspicion for acute bacterial infection, WBC up-trending, but likely secondary to steroids     Best practice (right click and "Reselect all SmartList Selections" daily)  Per primary  Labs   CBC: Recent Labs  Lab August 08, 2020 1602 08/05/20 0439 08/06/20 0433  WBC 15.8* 15.0* 21.9*  NEUTROABS 12.5*  --  20.0*  HGB 13.9 13.6 13.5  HCT 42.3 42.1 41.3  MCV 89.4 90.3 89.4  PLT 218 214 217    Basic Metabolic Panel: Recent Labs  Lab Aug 08, 2020 1724 08/05/20 0439 08/06/20 0433  NA 134* 134* 135  K 4.3 4.7 4.4  CL 101 101 101  CO2 26 26 25   GLUCOSE 120* 278* 238*  BUN 18 20 16   CREATININE 0.96 1.04 0.94  CALCIUM 8.9 9.3 9.6   GFR: Estimated Creatinine Clearance: 74.6 mL/min (by C-G formula based on SCr of 0.94 mg/dL). Recent Labs  Lab 08-08-20 1602 Aug 08, 2020 1724 08/05/20 0439 08/06/20 0433  PROCALCITON  --  <0.10  --   --   WBC 15.8*  --  15.0* 21.9*    Liver Function Tests: Recent Labs  Lab 08-08-2020 1724  AST 20  ALT 19  ALKPHOS 64  BILITOT 0.9  PROT 7.3  ALBUMIN 3.3*   Recent Labs  Lab August 08, 2020 1724  LIPASE 27   No results for input(s): AMMONIA in the last 168 hours.  ABG    Component Value Date/Time   PHART 7.435 2020/08/08 1856   PCO2ART 36.6 August 08, 2020 1856   PO2ART 56.4 (L) Aug 08, 2020 1856   HCO3 24.1 08-08-2020 1856   O2SAT 89.2 08-08-20 1856     Coagulation Profile: No results for input(s): INR, PROTIME in the last 168 hours.  Cardiac Enzymes: No results for input(s): CKTOTAL, CKMB, CKMBINDEX, TROPONINI in the last 168 hours.  HbA1C: No results found for: HGBA1C  CBG: No results for input(s): GLUCAP in the last 168 hours.  Review of  Systems:   Review of Systems  Constitutional: Positive for chills, malaise/fatigue and weight loss. Negative for fever.  Respiratory: Positive for cough, sputum production and shortness of breath. Negative for hemoptysis.   Cardiovascular: Negative for chest pain and leg swelling.     Past Medical History:  He,  has a past medical history of Hypertension.   Surgical History:  History reviewed. No pertinent surgical history.   Social History:   reports that he quit smoking about 21 years ago. His smoking use included cigarettes. He has a 5.00 pack-year smoking history. He has never used smokeless tobacco. He reports current alcohol use. He reports previous drug use.   Family History:  His family history is not on file.   Allergies No Known Allergies   Home Medications  Prior to Admission medications   Medication Sig Start Date  End Date Taking? Authorizing Provider  albuterol (VENTOLIN HFA) 108 (90 Base) MCG/ACT inhaler Inhale 2 puffs into the lungs every 6 (six) hours as needed for wheezing or shortness of breath.   Yes [provider]  Ascorbic Acid (VITAMIN C PO) Take 1 tablet by mouth daily.   Yes [provider]  cefdinir (OMNICEF) 300 MG capsule Take 300 mg by mouth 2 (two) times daily.   Yes [provider]  Cyanocobalamin (VITAMIN B-12 PO) Take 1 tablet by mouth daily.   Yes [provider]  ibuprofen (ADVIL) 200 MG tablet Take 400 mg by mouth every 6 (six) hours as needed for fever.   Yes [provider]  losartan (COZAAR) 25 MG tablet Take 25 mg by mouth daily.   Yes [provider]  MAGNESIUM PO Take 1 tablet by mouth daily.   Yes [provider]  OXYGEN Inhale 2 L into the lungs continuous.   Yes [provider]  predniSONE (DELTASONE) 10 MG tablet Take 4 tabs po daily x 3 days; then 3 tabs daily x3 days; then 2 tabs daily x3 days; then 1 tab daily x 3 days; then stop Patient taking differently: Take  10-40 mg by mouth See admin instructions. Take 4 tabs (40 mg) by mouth po daily x 3 days; then 3 tabs (30 mg) daily x3 days; then 2 tabs  (20 mg) daily x3 days; then 1 tab  (10 mg) daily x 3 days; then stop 07/23/20  Yes Glenford Bayley, NP  azithromycin (ZITHROMAX) 250 MG tablet Take 250-500 mg by mouth See admin instructions. Take 2 tablets (500 mg) by mouth 1st day, then take 1 tablet (250 mg) daily on days 2-5 07/18/20   [provider]  chlorproMAZINE (THORAZINE) 50 MG tablet Take 1 tablet (50 mg total) by mouth 2 (two) times daily as needed. Patient not taking: No sig reported 07/26/20   Tomma Lightning, MD  promethazine-dextromethorphan (PROMETHAZINE-DM) 6.25-15 MG/5ML syrup Take 5 mLs by mouth 3 (three) times daily as needed for cough. Patient not taking: Reported on 09/02/2020 07/19/20   Bing Neighbors, FNP     Critical care time: n/a     Darcella Gasman Khamya Topp, PA-C [facility] See Amion for pager If no response to pager , please call [phone] until 7pm After 7:00 pm call Elink  [phone]

## 2020-08-06 NOTE — Progress Notes (Signed)
Patient refused to allow nursing staff to complete MRSA swab from [facility] states my nose has been broken, and it hurts when you put things up it. Education provided patient continues to refuse.

## 2020-08-06 NOTE — Progress Notes (Addendum)
PROGRESS NOTE    Rylyn Ranganathan   IRJ:[id number]  DOB: 1955-09-16  PCP: Norm Salt, PA    DOA: 08/14/2020 LOS: 1   Assessment & Plan   Principal Problem:   Acute on chronic respiratory failure with hypoxia (HCC) Active Problems:   OSA (obstructive sleep apnea)   HTN (hypertension)   Acute on chronic respiratory failure with hypoxia Etiology of this is to be determined but seems very unlikely infectious at this point.  No known COVID-19 infection history.  CT scan showing diffuse alveolar damage. Viral panel is negative. --Pulmonology is following, appreciate recommendations. -- Continue IV steroids, nebs and Mucinex -- Needs bronchoscopy but deferred for now to monitor response to steroids as pt would require intubation for and after procedure and hopes to avoid. -- Follow-up sputum culture -- Pro-Cal is pending --Follow-up ANA, ANCA titers, ACE -- Monitor respiratory status very closely, patient may require heated HFNC and hopefully not intubation. Low threshold to transfer to stepdown unit.  Steroid-induced hyperglycemia - A1C pending.  Sensitive sliding scale Novolog for now & adjust insulin for inpatient goal 140-180  Essential hypertension -home losartan initially continued but stopped due to soft BP's.  Monitor BP.  Obstructive sleep apnea -CPAP had been recommended but not yet started.  Following with pulmonology.  Patient BMI: There is no height or weight on file to calculate BMI.   DVT prophylaxis: enoxaparin (LOVENOX) injection 40 mg Start: 08/03/2020 2200   Diet:  Diet Orders (From admission, onward)    Start     Ordered   08/02/2020 2115  Diet Heart Room service appropriate? Yes; Fluid consistency: Thin  Diet effective now       Question Answer Comment  Room service appropriate? Yes   Fluid consistency: Thin      08/05/2020 2115            Code Status: Full Code   Brief Narrative / Hospital Course to Date:   Per H&P by Dr. Cyndia Bent: "Samik Balkcom  is a 65 y.o. male with medical history significant for  HTN, OSA not yet on CPAP who presents with progressive worsening shortness of breath and cough.   Patient began to have symptoms around the middle of May with increasing shortness of breath with exertion and cough productive of white sputum.  He was first evaluated by his PCP and started on prednisone and azithromycin. Reportedly had negative COVID test.  However he did not have any improvement and presented to urgent care on 5/20.  He was noted to be hypoxic down to 80% on room air which improved following albuterol and IM Decadron.  Had negative COVID test again.  He was given a course of cefdinir.  Then on 5/24 he was seen by pulmonology and was started on 2 L of oxygen to use with exertion and at night.  Also given Mucinex, albuterol and IM steroids.  However he continued to have worsening symptoms and had a repeat follow-up with pulmonology on 5/27 where his oxygen was increased to 4 L. For the past several days he had to use O2 Continuously and could not walk several feet without shortness of breath.  Had an episode of chills about 2 days ago but did not take temperature.  Also had 1 episode of chest pain yesterday for several minutes when laying down.  He has also not been able to lay flat and has to sleep in a reclined.  Denies any lower extremity edema. Only used albuterol inhaler once  today.   No recent travel. 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 Work is strict about wearing mask and denies coworkers with similar symptoms.  He endorses occasional tobacco use for many years but usually only smokes during gathering.  He reports a history of pneumonia about 30 years ago where he required mechanical ventilation.  Denies any family history of any pulmonary diseases.   He was recently diagnosed with OSA several months ago and is working with pulm to get his CPAP.   ED Course: He was afrebile with borderline blood  pressure of 102/58. EMS noted pt to be 77% with O2.Now on 4L.  WBC of 15.8.  Sodium 134.  BG 120.  ABG reassuring.  CTA chest significant motion artifacts so PE study was limited but he was noted to have diffuse alveolar damage typical with atypical infection, drug reaction versus connective tissue disorder."    Subjective 08/06/20    Patient seen this morning with wife and daughter at bedside.  Transferred to stepdown this AM due to increasing O2 needs.  Pt sleeping soundly, comfortable.  No acute events reported.  Sats on monitor 95-100% on 15 L/min with Abram and NRB mask.   Disposition Plan & Communication   Status is: Inpatient  Remains inpatient appropriate because:IV treatments appropriate due to intensity of illness or inability to take PO   Dispo: The patient is from: Home              Anticipated d/c is to: Home              Patient currently is not medically stable to d/c.   Difficult to place patient No  Family Communication: Wife and daughter at bedside on rounds today 6/7.   Consults, Procedures, Significant Events   Consultants:   Pulmonology [facility]  Procedures:   None  Antimicrobials:  Anti-infectives (From admission, onward)   None        Micro    Objective   Vitals:   08/06/20 1200 08/06/20 1300 08/06/20 1500 08/06/20 1600  BP: (!) 143/96 129/66 (!) 143/63   Pulse: 98 87 72   Resp: (!) 43 (!) 43 (!) 25   Temp: 98 F (36.7 C)   97.8 F (36.6 C)  TempSrc: Oral   Oral  SpO2: 90% 97% 94%     Intake/Output Summary (Last 24 hours) at 08/06/2020 1651 Last data filed at 08/06/2020 0600 Gross per 24 hour  Intake 1080 ml  Output 1300 ml  Net -220 ml   There were no vitals filed for this visit.  Physical Exam:  General exam: sleeping comfortably, no acute distress, ill-appearing HEENT: nasal cannula with non-rebreather mask in place  Respiratory system: Diffuse rhonchi, shallow inspirations, mildly increased respiratory effort.  On 15 L/min  supplemental oxygen with O2 sat on monitor 95-100% Cardiovascular system: normal S1/S2, RRR, no pedal edema.   Extremities: normal tone, no cyanosis, no edema   Labs   Data Reviewed: I have personally reviewed following labs and imaging studies  CBC: Recent Labs  Lab 08-31-20 1602 08/05/20 0439 08/06/20 0433  WBC 15.8* 15.0* 21.9*  NEUTROABS 12.5*  --  20.0*  HGB 13.9 13.6 13.5  HCT 42.3 42.1 41.3  MCV 89.4 90.3 89.4  PLT 218 214 217   Basic Metabolic Panel: Recent Labs  Lab 08-31-2020 1724 08/05/20 0439 08/06/20 0433  NA 134* 134* 135  K 4.3 4.7 4.4  CL 101 101 101  CO2 26 26  25  GLUCOSE 120* 278* 238*  BUN 18 20 16   CREATININE 0.96 1.04 0.94  CALCIUM 8.9 9.3 9.6   GFR: Estimated Creatinine Clearance: 74.6 mL/min (by C-G formula based on SCr of 0.94 mg/dL). Liver Function Tests: Recent Labs  Lab 08/20/2020 1724  AST 20  ALT 19  ALKPHOS 64  BILITOT 0.9  PROT 7.3  ALBUMIN 3.3*   Recent Labs  Lab 08/05/2020 1724  LIPASE 27   No results for input(s): AMMONIA in the last 168 hours. Coagulation Profile: No results for input(s): INR, PROTIME in the last 168 hours. Cardiac Enzymes: No results for input(s): CKTOTAL, CKMB, CKMBINDEX, TROPONINI in the last 168 hours. BNP (last 3 results) No results for input(s): PROBNP in the last 8760 hours. HbA1C: No results for input(s): HGBA1C in the last 72 hours. CBG: No results for input(s): GLUCAP in the last 168 hours. Lipid Profile: No results for input(s): CHOL, HDL, LDLCALC, TRIG, CHOLHDL, LDLDIRECT in the last 72 hours. Thyroid Function Tests: No results for input(s): TSH, T4TOTAL, FREET4, T3FREE, THYROIDAB in the last 72 hours. Anemia Panel: No results for input(s): VITAMINB12, FOLATE, FERRITIN, TIBC, IRON, RETICCTPCT in the last 72 hours. Sepsis Labs: Recent Labs  Lab 08/03/2020 1724  PROCALCITON <0.10    Recent Results (from the past 240 hour(s))  Resp Panel by RT-PCR (Flu A&B, Covid) Nasopharyngeal Swab      Status: None   Collection Time: 08/27/2020  4:03 PM   Specimen: Nasopharyngeal Swab; Nasopharyngeal(NP) swabs in vial transport medium  Result Value Ref Range Status   SARS Coronavirus 2 by RT PCR NEGATIVE NEGATIVE Final    Comment: (NOTE) SARS-CoV-2 target nucleic acids are NOT DETECTED.  The SARS-CoV-2 RNA is generally detectable in upper respiratory specimens during the acute phase of infection. The lowest concentration of SARS-CoV-2 viral copies this assay can detect is 138 copies/mL. A negative result does not preclude SARS-Cov-2 infection and should not be used as the sole basis for treatment or other patient management decisions. A negative result may occur with  improper specimen collection/handling, submission of specimen other than nasopharyngeal swab, presence of viral mutation(s) within the areas targeted by this assay, and inadequate number of viral copies(<138 copies/mL). A negative result must be combined with clinical observations, patient history, and epidemiological information. The expected result is Negative.  Fact Sheet for Patients:  10/04/20  Fact Sheet for Healthcare Providers:  BloggerCourse.com  This test is no t yet approved or cleared by the [employer] FDA and  has been authorized for detection and/or diagnosis of SARS-CoV-2 by FDA under an Emergency Use Authorization (EUA). This EUA will remain  in effect (meaning this test can be used) for the duration of the COVID-19 declaration under Section 564(b)(1) of the Act, 21 U.S.C.section 360bbb-3(b)(1), unless the authorization is terminated  or revoked sooner.       Influenza A by PCR NEGATIVE NEGATIVE Final   Influenza B by PCR NEGATIVE NEGATIVE Final    Comment: (NOTE) The Xpert Xpress SARS-CoV-2/FLU/RSV plus assay is intended as an aid in the diagnosis of influenza from Nasopharyngeal swab specimens and should not be used as a sole basis  for treatment. Nasal washings and aspirates are unacceptable for Xpert Xpress SARS-CoV-2/FLU/RSV testing.  Fact Sheet for Patients: [place]  Fact Sheet for Healthcare Providers: BloggerCourse.com  This test is not yet approved or cleared by the [employer] FDA and has been authorized for detection and/or diagnosis of SARS-CoV-2 by FDA under an Emergency Use Authorization (EUA).  This EUA will remain in effect (meaning this test can be used) for the duration of the COVID-19 declaration under Section 564(b)(1) of the Act, 21 U.S.C. section 360bbb-3(b)(1), unless the authorization is terminated or revoked.  Performed at [facility], [address], [place], [place] [address]   Respiratory (~20 pathogens) panel by PCR     Status: None   Collection Time: 08/05/20 12:40 AM   Specimen: Nasopharyngeal Swab; Respiratory  Result Value Ref Range Status   Adenovirus NOT DETECTED NOT DETECTED Final   Coronavirus 229E NOT DETECTED NOT DETECTED Final    Comment: (NOTE) The Coronavirus on the Respiratory Panel, DOES NOT test for the novel  Coronavirus (2019 nCoV)    Coronavirus HKU1 NOT DETECTED NOT DETECTED Final   Coronavirus NL63 NOT DETECTED NOT DETECTED Final   Coronavirus OC43 NOT DETECTED NOT DETECTED Final   Metapneumovirus NOT DETECTED NOT DETECTED Final   Rhinovirus / Enterovirus NOT DETECTED NOT DETECTED Final   Influenza A NOT DETECTED NOT DETECTED Final   Influenza B NOT DETECTED NOT DETECTED Final   Parainfluenza Virus 1 NOT DETECTED NOT DETECTED Final   Parainfluenza Virus 2 NOT DETECTED NOT DETECTED Final   Parainfluenza Virus 3 NOT DETECTED NOT DETECTED Final   Parainfluenza Virus 4 NOT DETECTED NOT DETECTED Final   Respiratory Syncytial Virus NOT DETECTED NOT DETECTED Final   Bordetella pertussis NOT DETECTED NOT DETECTED Final   Bordetella Parapertussis NOT DETECTED NOT DETECTED Final    Chlamydophila pneumoniae NOT DETECTED NOT DETECTED Final   Mycoplasma pneumoniae NOT DETECTED NOT DETECTED Final    Comment: Performed at [facility], [address], [place], [place] [address]  Expectorated Sputum Assessment w Gram Stain, Rflx to Resp Cult     Status: None   Collection Time: 08/06/20  1:00 AM   Specimen: Expectorated Sputum  Result Value Ref Range Status   Specimen Description EXPECTORATED SPUTUM  Final   Special Requests NONE  Final   Sputum evaluation   Final    Sputum specimen not acceptable for testing.  Please recollect.   RESULT CALLED TO, READ BACK BY AND VERIFIED WITH: CHRIS, RN @ 0236 ON 08/06/20 Riesa Pope Performed at [facility], [address], [place], [place] [address]    Report Status 08/06/2020 FINAL  Final      Imaging Studies   CT Angio Chest PE W and/or Wo Contrast  Result Date: 2020/09/01 CLINICAL DATA:  Pneumonia, hypoxia, cough, elevated D-dimer EXAM: CT ANGIOGRAPHY CHEST WITH CONTRAST TECHNIQUE: Multidetector CT imaging of the chest was performed using the standard protocol during bolus administration of intravenous contrast. Multiplanar CT image reconstructions and MIPs were obtained to evaluate the vascular anatomy. CONTRAST:  80mL OMNIPAQUE IOHEXOL 350 MG/ML SOLN COMPARISON:  None. FINDINGS: Cardiovascular: The examination is markedly limited by motion artifact as the patient coughed during the initial scan. Subsequent imaging was performed essentially during venous phase of enhancement. As result, the examination is adequate only for exclusion of intraluminal filling defect within the main and central right and left pulmonary arteries of which there is none. The pulmonary arterial vasculature distally is inadequately imaged to definitively exclude the presence of small intraluminal filling defect. The central pulmonary arteries are of normal caliber. Extensive coronary artery calcification. Global cardiac size within normal  limits. No pericardial effusion. The thoracic aorta is of normal caliber. Mild atherosclerotic calcification noted within the a thoracic aorta. Mediastinum/Nodes: There is shotty mediastinal and bilateral hilar adenopathy, possibly  reactive in nature. No frankly pathologically enlarged lymph nodes are identified within the thorax, however. Small hiatal hernia. Esophagus unremarkable. The visualized thyroid is unremarkable. Lungs/Pleura: Lung volumes are small, but are symmetric. There is extensive bilateral ground-glass pulmonary infiltrate and patchy consolidation demonstrating a peripheral and mid lung zone predominance with traction bronchiolectasis, septal thickening best appreciated within the lung bases, and subtle subpleural fibrosis. The findings are suggestive of acute to organizing phase of diffuse alveolar damage and can be seen in subacute atypical infection, including viral pneumonia such as COVID 19 pneumonia, drug reaction, and connective tissue disorders. No pneumothorax or pleural effusion. The central airways are widely patent. Upper Abdomen: No acute abnormality. Musculoskeletal: No acute bone abnormality. No lytic or blastic bone lesions. Review of the MIP images confirms the above findings. IMPRESSION: Widespread pulmonary parenchymal abnormality most suggestive of subacute diffuse lung injury/diffuse alveolar damage. Differential considerations are as listed above. Serologic correlation for COVID-19 pneumonia may be helpful for further management. Technically limited examination without evidence of large central pulmonary embolism. Extensive coronary artery calcification Aortic Atherosclerosis (ICD10-I70.0). Electronically Signed   By: Helyn NumbersAshesh  Parikh MD   On: 01-25-2021 19:48     Medications   Scheduled Meds: . budesonide (PULMICORT) nebulizer solution  0.25 mg Nebulization BID  . Chlorhexidine Gluconate Cloth  6 each Topical Daily  . dextromethorphan-guaiFENesin  1 tablet Oral BID  .  enoxaparin (LOVENOX) injection  40 mg Subcutaneous Q24H  . ipratropium-albuterol  3 mL Nebulization Q4H WA  . mouth rinse  15 mL Mouth Rinse BID  . methylPREDNISolone (SOLU-MEDROL) injection  40 mg Intravenous Q12H   Continuous Infusions: . sodium chloride 75 mL/hr at 08/06/20 1522       LOS: 1 day    Time spent: 30 minutes with > 50% spent at bedside and in coordination of care.    Pennie BanterKelly A Vinette Crites, DO [facility]  08/06/2020, 4:51 PM      If 7PM-7AM, please contact night-coverage. How to contact the [facility] Attending or Consulting provider 7A - 7P or covering provider during after hours 7P -7A, for this patient?    1. Check the care team in [facility] and look for a) attending/consulting [facility] provider listed and b) the [facility] team listed 2. Log into [email or web address] and use [facility]'s universal password to access. If you do not have the password, please contact the hospital operator. 3. Locate the [facility] provider you are looking for under [facility] and page to a number that you can be directly reached. 4. If you still have difficulty reaching the provider, please page the [facility] (Director [facility] Call) for the Hospitalists listed on amion for assistance.

## 2020-08-06 NOTE — Plan of Care (Signed)
  Problem: Health Behavior/Discharge Planning: Goal: Ability to manage health-related needs will improve Outcome: Progressing   Problem: Clinical Measurements: Goal: Ability to maintain clinical measurements within normal limits will improve Outcome: Progressing Goal: Will remain free from infection Outcome: Progressing Goal: Diagnostic test results will improve Outcome: Progressing Goal: Respiratory complications will improve Outcome: Progressing   Problem: Activity: Goal: Risk for activity intolerance will decrease Outcome: Progressing   Problem: Coping: Goal: Level of anxiety will decrease Outcome: Progressing   Problem: Elimination: Goal: Will not experience complications related to bowel motility Outcome: Progressing Goal: Will not experience complications related to urinary retention Outcome: Progressing   Problem: Pain Managment: Goal: General experience of comfort will improve Outcome: Progressing   Problem: Safety: Goal: Ability to remain free from injury will improve Outcome: Progressing   Problem: Skin Integrity: Goal: Risk for impaired skin integrity will decrease Outcome: Progressing

## 2020-08-07 ENCOUNTER — Inpatient Hospital Stay (HOSPITAL_COMMUNITY): Payer: Medicare Other

## 2020-08-07 DIAGNOSIS — R0603 Acute respiratory distress: Secondary | ICD-10-CM

## 2020-08-07 LAB — CBC
HCT: 39.3 % (ref 39.0–52.0)
Hemoglobin: 12.6 g/dL — ABNORMAL LOW (ref 13.0–17.0)
MCH: 29.2 pg (ref 26.0–34.0)
MCHC: 32.1 g/dL (ref 30.0–36.0)
MCV: 91 fL (ref 80.0–100.0)
Platelets: 195 10*3/uL (ref 150–400)
RBC: 4.32 MIL/uL (ref 4.22–5.81)
RDW: 14.6 % (ref 11.5–15.5)
WBC: 22.3 10*3/uL — ABNORMAL HIGH (ref 4.0–10.5)
nRBC: 0 % (ref 0.0–0.2)

## 2020-08-07 LAB — TSH: TSH: 0.232 u[IU]/mL — ABNORMAL LOW (ref 0.350–4.500)

## 2020-08-07 LAB — BLOOD GAS, ARTERIAL
Acid-Base Excess: 3.2 mmol/L — ABNORMAL HIGH (ref 0.0–2.0)
Bicarbonate: 27.1 mmol/L (ref 20.0–28.0)
Drawn by: 23281
FIO2: 80
O2 Content: 15 L/min
O2 Saturation: 85.4 %
Patient temperature: 98.5
pCO2 arterial: 40.5 mmHg (ref 32.0–48.0)
pH, Arterial: 7.441 (ref 7.350–7.450)
pO2, Arterial: 50.8 mmHg — ABNORMAL LOW (ref 83.0–108.0)

## 2020-08-07 LAB — ECHOCARDIOGRAM COMPLETE
AR max vel: 1.96 cm2
AV Area VTI: 2.04 cm2
AV Area mean vel: 1.92 cm2
AV Mean grad: 8 mmHg
AV Peak grad: 16 mmHg
Ao pk vel: 2 m/s
Area-P 1/2: 3.72 cm2
S' Lateral: 3.1 cm

## 2020-08-07 LAB — BASIC METABOLIC PANEL
Anion gap: 9 (ref 5–15)
BUN: 19 mg/dL (ref 8–23)
CO2: 24 mmol/L (ref 22–32)
Calcium: 9.2 mg/dL (ref 8.9–10.3)
Chloride: 103 mmol/L (ref 98–111)
Creatinine, Ser: 0.84 mg/dL (ref 0.61–1.24)
GFR, Estimated: >60 mL/min (ref >60)
Glucose, Bld: 203 mg/dL — ABNORMAL HIGH (ref 70–99)
Potassium: 4.8 mmol/L (ref 3.5–5.1)
Sodium: 136 mmol/L (ref 135–145)

## 2020-08-07 LAB — GLUCOSE, CAPILLARY
Glucose-Capillary: 205 mg/dL — ABNORMAL HIGH (ref 70–99)
Glucose-Capillary: 212 mg/dL — ABNORMAL HIGH (ref 70–99)
Glucose-Capillary: 133 mg/dL — ABNORMAL HIGH (ref 70–99)
Glucose-Capillary: 205 mg/dL — ABNORMAL HIGH (ref 70–99)
Glucose-Capillary: 67 mg/dL — ABNORMAL LOW (ref 70–99)

## 2020-08-07 LAB — PROCALCITONIN: Procalcitonin: 0.15 ng/mL

## 2020-08-07 LAB — LEGIONELLA PNEUMOPHILA SEROGP 1 UR AG: L. pneumophila Serogp 1 Ur Ag: NEGATIVE

## 2020-08-07 LAB — SEDIMENTATION RATE: Sed Rate: 74 mm/hr — ABNORMAL HIGH (ref 0–16)

## 2020-08-07 LAB — ANTI-DNA ANTIBODY, DOUBLE-STRANDED: ds DNA Ab: 1 IU/mL (ref 0–9)

## 2020-08-07 LAB — GLOMERULAR BASEMENT MEMBRANE ANTIBODIES: GBM Ab: 3 units (ref 0–20)

## 2020-08-07 LAB — RHEUMATOID FACTOR: Rheumatoid fact SerPl-aCnc: 17.8 IU/mL — ABNORMAL HIGH (ref <14.0)

## 2020-08-07 LAB — HEMOGLOBIN A1C
Hgb A1c MFr Bld: 7.3 % — ABNORMAL HIGH (ref 4.8–5.6)
Mean Plasma Glucose: 163 mg/dL

## 2020-08-07 MED ORDER — FUROSEMIDE 10 MG/ML IJ SOLN
20.0000 mg | Freq: Once | INTRAMUSCULAR | Status: AC
  Administered 2020-08-07: 20 mg via INTRAVENOUS
  Filled 2020-08-07: qty 2

## 2020-08-07 MED ORDER — INSULIN ASPART 100 UNIT/ML IJ SOLN
0.0000 [IU] | INTRAMUSCULAR | Status: DC
  Administered 2020-08-07 (×2): 7 [IU] via SUBCUTANEOUS
  Administered 2020-08-07 – 2020-08-08 (×2): 3 [IU] via SUBCUTANEOUS
  Administered 2020-08-08 (×2): 7 [IU] via SUBCUTANEOUS
  Administered 2020-08-08 – 2020-08-09 (×2): 4 [IU] via SUBCUTANEOUS
  Administered 2020-08-09 – 2020-08-10 (×5): 3 [IU] via SUBCUTANEOUS
  Administered 2020-08-10 – 2020-08-11 (×5): 4 [IU] via SUBCUTANEOUS
  Administered 2020-08-12 (×2): 7 [IU] via SUBCUTANEOUS

## 2020-08-07 MED ORDER — DEXTROSE 50 % IV SOLN
12.5000 g | INTRAVENOUS | Status: AC
  Administered 2020-08-07: 12.5 g via INTRAVENOUS
  Filled 2020-08-07: qty 50

## 2020-08-07 MED ORDER — SODIUM CHLORIDE 0.9 % IV SOLN
2.0000 g | Freq: Three times a day (TID) | INTRAVENOUS | Status: DC
  Administered 2020-08-07 – 2020-08-13 (×20): 2 g via INTRAVENOUS
  Filled 2020-08-07 (×19): qty 2

## 2020-08-07 MED ORDER — INSULIN GLARGINE 100 UNIT/ML ~~LOC~~ SOLN
8.0000 [IU] | Freq: Every day | SUBCUTANEOUS | Status: DC
  Administered 2020-08-07: 8 [IU] via SUBCUTANEOUS
  Filled 2020-08-07: qty 0.08

## 2020-08-07 MED ORDER — BUDESONIDE 0.5 MG/2ML IN SUSP
0.5000 mg | Freq: Two times a day (BID) | RESPIRATORY_TRACT | Status: DC
  Administered 2020-08-07 – 2020-08-12 (×11): 0.5 mg via RESPIRATORY_TRACT
  Filled 2020-08-07 (×11): qty 2

## 2020-08-07 NOTE — Progress Notes (Signed)
Pharmacy Antibiotic Note  Thomas Glass is a 65 y.o. male admitted on 08/08/2020 with acute on chronic respiratory failure. Recent course of azithromycin and cefdinir  Pharmacy has been consulted for cefepime dosing.  Plan: Cefepime 2 g iv q 8 hours  Will f/u renal function, culture results, and plans for antibiotics      Temp (24hrs), Avg:98.1 F (36.7 C), Min:97.8 F (36.6 C), Max:98.5 F (36.9 C)  Recent Labs  Lab 08/13/2020 1602 08/25/2020 1724 08/05/20 0439 08/06/20 0433 08/07/20 0233  WBC 15.8*  --  15.0* 21.9* 22.3*  CREATININE  --  0.96 1.04 0.94 0.84    Estimated Creatinine Clearance: 83.5 mL/min (by C-G formula based on SCr of 0.84 mg/dL).    No Known Allergies  Antimicrobials this admission: 6/8 cefepime >>   Dose adjustments this admission:  Microbiology results: 6/5 COVID/Flu: neg/neg 6/6 resp panel: neg 6/7 MRSA PCR: neg 6/7 SCx: few GPC    Thank you for allowing pharmacy to be a part of this patient's care.  Luisa Hart D 08/07/2020 8:16 AM

## 2020-08-07 NOTE — Progress Notes (Addendum)
Hypoglycemic Event  CBG: 67  Treatment: D50 25 mL (12.5 gm)  Symptoms: Nervous/irritable  Follow-up CBG: Time: 0005 CBG Result: 115  Possible Reasons for Event: Inadequate meal intake  Comments/MD notified: Hypoglycemic protocol followed. Notified Mansy  Lantus discontinued    Alycia Rossetti, Janna Arch

## 2020-08-07 NOTE — Progress Notes (Addendum)
NAME:  Thomas Glass, MRN:  [id number], DOB:  24-Jul-1955, LOS: 2 ADMISSION DATE:  08/14/2020, CONSULTATION DATE:  08/07/20 REFERRING MD:  Denton Lank, CHIEF COMPLAINT:  Shortness of breath    History of Present Illness:  65 y.o. M with PMH of HTN and remote tobacco use along with hospitalization for PNA >57yrs ago who was in his usual state of health until approximately 6 months ago when he began to develop shortness of breath, seen at pulmonary office with concern for OSA, did not start CPAP.   Then over the last two weeks developed progressive productive cough and exertional dyspnea.   He was seen in urgent care and PCP and prescribed Azithromycin, nebs and steroids without relief.  Cefdenir was added and pt still had persistent shortness of breath.   He followed up with pulmonology, oxygen was 82% on RA at the time and was send home on 2L home O2 and given steroid injection.  All Covid-19 tests have been negative.   Followed with pulm on 5/27 and plan to start CPAP and increase home O2 to 4L.   However over the last week pt's wife has noted increasing cough and shortness of breath to the point that he cannot walk short distances, so called EMS.    They report decreased appetite and chills, no measured fever.   In the ED, pt was initially hypoxic to 72% after running out of his home oxygen.   Oxygen improved with nasal cannula, work-up significant for WBC 15k.  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  Pt was admitted to the hospitalists and PCCM consulted.    Pt reports that he smoked 1-2 cigarettes per day, but quit over 17yrs ago.  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  No significant family history of lung disease and no known allergies, denies new pets or known mold exposure.    Pt speaks some English, seen with wife who felt comfortable translating as  needed.    Pertinent  Medical History  HTN  Significant Hospital Events: Including procedures, antibiotic start and stop dates in addition to other pertinent events   . 6/5 Admit to hospitalists . 6/6 increasing O2 requirement, PCCM consult . 6/8 Requiring 15L NRB + [place] O2     Micro: 6/6 Respiratory viral panel>>negative 6/5 Covid-19>>negative 6/5 Sputum culture>> 6/7 Strep Pneumo >> negative   Antibiotics:     Interim History / Subjective:  Remains on [place] O2 + NRB  Reports feeling tired Pt denies ever having his glucose or TSH checked  Objective   Blood pressure (!) 136/56, pulse (!) 104, temperature 98.5 F (36.9 C), temperature source Axillary, resp. rate (!) 24, SpO2 91 %.    FiO2 (%):  [100 %] 100 %   Intake/Output Summary (Last 24 hours) at 08/07/2020 4210 Last data filed at 08/07/2020 0700 Gross per 24 hour  Intake 1172.5 ml  Output --  Net 1172.5 ml   There were no vitals filed for this visit.  General: adult male lying in bed, ill appearing HEENT: MM pink/moist, [place] + NRB, anicteric, mild exophthalmus  Neuro: AAOx4, speech clear, MAE CV: s1s2 RRR, ST, no m/r/g PULM: tachypneic, lungs bilaterally with fine crackles  GI: soft, bsx4 active  Extremities: warm/dry, no edema  Skin: no rashes or lesions  Labs/imaging that I havepersonally reviewed  (right click and "Reselect all SmartList  Selections" daily)  CBC - WBC 22.3 BMP - Glucose 202 PCXR - diffuse interstitial infiltrates  CT Chest - diffuse airspace disease, enlarged pulmonary trunk Micro  BP trends  Resolved Hospital Problem list     Assessment & Plan:    Acute on Chronic Hypoxic Respiratory Failure Rule out ILD, Possible Acute Flare CT with diffuse alveolar damage, no known Covid-19 infection. Treated with multiple courses of antibiotics and steroids without improvement. CRP elevated, sed rate mildly up, ANCA titers pending. Viral panel negative. Minimal eosinophils on differential on  admit.   -high risk for respiratory decompensation requiring intubation  -O2 to support saturations >90%, anticipate desaturations with exertion and slow recovery  -reduce IVF to [facility]  -lasix 20 mg IVx1  -follow I/O's  -continue steroids  -begin empiric cefepime (though, doubt bacterial component here) -repeat PCT (has been negative) -await ECHO findings, note enlarged pulmonary trunk on CT  -follow up respiratory culture -follow up pending ANCA, Legionella, cytology, HSP, DSDNA, GBM, CCP, RF -repeat ESR -if intubated, will need bronchoscopy.  Possible lung biopsy.  Exophthalmus  -assess TSH   DM Hgb A1c 7.3 on admit, new dx -per primary    Best practice (right click and "Reselect all SmartList Selections" daily)  Per primary   Critical care time: n/a     Noe Gens, MSN, APRN, NP-C, AGACNP-BC [facility] 08/07/2020, 7:02 AM   Please see [email or web address] for pager details.   From 7A-7P if no response, please call [phone] After hours, please call ELink [phone]

## 2020-08-07 NOTE — Progress Notes (Addendum)
[facility] PROGRESS NOTE   Thomas Glass WNU:[id number] DOB: 01/03/1956 DOA: 07/31/2020  PCP: Trey Sailors, PA  Brief History/Interval Summary: 65 y.o.malewith medical history significant forHTN, OSA not yet on CPAP whopresented with progressive worsening shortness of breath and cough.  Patient was seen by pulmonology.  He had a negative COVID-19 test.  He has had several outpatient visits with pulmonology as well.  Has been on steroids and antibiotics.  Currently requiring high amounts of oxygen.  Now on BiPAP.  Reason for Visit: Acute respiratory failure with hypoxia  Consultants: Pulmonology  Procedures: None yet  Antibiotics: Anti-infectives (From admission, onward)   Start     Dose/Rate Route Frequency Ordered Stop   08/07/20 0900  ceFEPIme (MAXIPIME) 2 g in sodium chloride 0.9 % 100 mL IVPB        2 g 200 mL/hr over 30 Minutes Intravenous Every 8 hours 08/07/20 0801        Subjective/Interval History: Patient's daughter is at the bedside who was able to interpret.  Patient denies any chest pain but has been having difficulty breathing.  Apparently his oxygen levels were low this morning and he was placed on BiPAP.    Assessment/Plan:  Acute respiratory failure with hypoxia Etiology is unclear.  CT scan of the lung showed diffuse alveolar damage.  Viral panel was negative.  Pulmonology continues to follow.  Patient remains on IV steroids nebulizer treatments, cefepime. Noted to be on BiPAP this morning.  Has been on high flow nasal cannula previously.  Remains at high risk for intubation. ESR noted to be 74.   TSH 0.23.  We will check free T4. Procalcitonin 0.15. Rheumatoid factor 17.8.  Angiotensin-converting enzyme was 40 which is within the normal range. Echocardiogram is pending.  Steroid-induced hyperglycemia Continue sliding-scale coverage.  May need to increase the dose and add basal insulin.  HbA1c 7.3.  Essential hypertension Losartan was  discontinued due to soft blood pressures.  Continue to monitor.  History of obstructive sleep apnea Currently on BiPAP.  Obesity Estimated body mass index is 30.04 kg/m as calculated from the following:   Height as of 07/26/20: _0  (1.626 m).   Weight as of 07/26/20: 79.4 kg.   DVT Prophylaxis: Lovenox Code Status: Full code Family Communication: Discussed with the patient and his daughter Disposition Plan: Remain in ICU for now.  Hopefully home when improved.  Status is: Inpatient  Remains inpatient appropriate because:IV treatments appropriate due to intensity of illness or inability to take PO and Inpatient level of care appropriate due to severity of illness   Dispo: The patient is from: Home              Anticipated d/c is to: Home              Patient currently is not medically stable to d/c.   Difficult to place patient No       Medications:  Scheduled: . budesonide (PULMICORT) nebulizer solution  0.5 mg Nebulization BID  . Chlorhexidine Gluconate Cloth  6 each Topical Daily  . dextromethorphan-guaiFENesin  1 tablet Oral BID  . enoxaparin (LOVENOX) injection  40 mg Subcutaneous Q24H  . insulin aspart  0-9 Units Subcutaneous TID WC  . ipratropium-albuterol  3 mL Nebulization Q4H WA  . mouth rinse  15 mL Mouth Rinse BID  . methylPREDNISolone (SOLU-MEDROL) injection  40 mg Intravenous Q12H   Continuous: . sodium chloride 10 mL/hr at 08/07/20 0729  . ceFEPime (MAXIPIME) IV Stopped (08/07/20  5974)   BUL:AGTXMIWOE, hydrALAZINE   Objective:  Vital Signs  Vitals:   08/07/20 0700 08/07/20 0736 08/07/20 0800 08/07/20 0900  BP: (!) 136/56  (!) 158/139 137/64  Pulse: (!) 104 96 91 84  Resp: (!) 24 (!) 46 (!) 36 (!) 30  Temp:   99.3 F (37.4 C)   TempSrc:   Axillary   SpO2: 91% 93% 96% 95%    Intake/Output Summary (Last 24 hours) at 08/07/2020 0957 Last data filed at 08/07/2020 3212 Gross per 24 hour  Intake 1172.5 ml  Output 550 ml  Net 622.5 ml   There  were no vitals filed for this visit.  General appearance: Awake alert.  In no distress Resp: Noted to be tachypneic.  Some use of accessory muscles noted.  Crackles bilateral bases.  No wheezing or rhonchi appreciated. Cardio: S1-S2 is normal regular.  No S3-S4.  No rubs murmurs or bruit GI: Abdomen is soft.  Nontender nondistended.  Bowel sounds are present normal.  No masses organomegaly Extremities: No edema.  Full range of motion of lower extremities. Neurologic:   No focal neurological deficits.    Lab Results:  Data Reviewed: I have personally reviewed following labs and imaging studies  CBC: Recent Labs  Lab 08/09/2020 1602 08/05/20 0439 08/06/20 0433 08/07/20 0233  WBC 15.8* 15.0* 21.9* 22.3*  NEUTROABS 12.5*  --  20.0*  --   HGB 13.9 13.6 13.5 12.6*  HCT 42.3 42.1 41.3 39.3  MCV 89.4 90.3 89.4 91.0  PLT 218 214 217 248    Basic Metabolic Panel: Recent Labs  Lab 08/24/2020 1724 08/05/20 0439 08/06/20 0433 08/07/20 0233  NA 134* 134* 135 136  K 4.3 4.7 4.4 4.8  CL 101 101 101 103  CO2 _0 GLUCOSE 120* 278* 238* 203*  BUN _1 CREATININE 0.96 1.04 0.94 0.84  CALCIUM 8.9 9.3 9.6 9.2    GFR: Estimated Creatinine Clearance: 83.5 mL/min (by C-G formula based on SCr of 0.84 mg/dL).  Liver Function Tests: Recent Labs  Lab 08/03/2020 1724  AST 20  ALT 19  ALKPHOS 64  BILITOT 0.9  PROT 7.3  ALBUMIN 3.3*    Recent Labs  Lab 08/24/2020 1724  LIPASE 27   HbA1C: Recent Labs    08/06/20 0433  HGBA1C 7.3*    CBG: Recent Labs  Lab 08/06/20 2125 08/07/20 0744  GLUCAP 275* 205*    Thyroid Function Tests: Recent Labs    08/07/20 0803  TSH 0.232*     Recent Results (from the past 240 hour(s))  Resp Panel by RT-PCR (Flu A&B, Covid) Nasopharyngeal Swab     Status: None   Collection Time: 08/22/2020  4:03 PM   Specimen: Nasopharyngeal Swab; Nasopharyngeal(NP) swabs in vial transport medium  Result Value Ref Range Status   SARS  Coronavirus 2 by RT PCR NEGATIVE NEGATIVE Final    Comment: (NOTE) SARS-CoV-2 target nucleic acids are NOT DETECTED.  The SARS-CoV-2 RNA is generally detectable in upper respiratory specimens during the acute phase of infection. The lowest concentration of SARS-CoV-2 viral copies this assay can detect is 138 copies/mL. A negative result does not preclude SARS-Cov-2 infection and should not be used as the sole basis for treatment or other patient management decisions. A negative result may occur with  improper specimen collection/handling, submission of specimen other than nasopharyngeal swab, presence of viral mutation(s) within the areas targeted by this assay, and inadequate number of viral copies(<138 copies/mL). A  negative result must be combined with clinical observations, patient history, and epidemiological information. The expected result is Negative.  Fact Sheet for Patients:  EntrepreneurPulse.com.au  Fact Sheet for Healthcare Providers:  IncredibleEmployment.be  This test is no t yet approved or cleared by the [place] FDA and  has been authorized for detection and/or diagnosis of SARS-CoV-2 by FDA under an Emergency Use Authorization (EUA). This EUA will remain  in effect (meaning this test can be used) for the duration of the COVID-19 declaration under Section 564(b)(1) of the Act, 21 U.S.C.section 360bbb-3(b)(1), unless the authorization is terminated  or revoked sooner.       Influenza A by PCR NEGATIVE NEGATIVE Final   Influenza B by PCR NEGATIVE NEGATIVE Final    Comment: (NOTE) The Xpert Xpress SARS-CoV-2/FLU/RSV plus assay is intended as an aid in the diagnosis of influenza from Nasopharyngeal swab specimens and should not be used as a sole basis for treatment. Nasal washings and aspirates are unacceptable for Xpert Xpress SARS-CoV-2/FLU/RSV testing.  Fact Sheet for  Patients: EntrepreneurPulse.com.au  Fact Sheet for Healthcare Providers: IncredibleEmployment.be  This test is not yet approved or cleared by the [place] FDA and has been authorized for detection and/or diagnosis of SARS-CoV-2 by FDA under an Emergency Use Authorization (EUA). This EUA will remain in effect (meaning this test can be used) for the duration of the COVID-19 declaration under Section 564(b)(1) of the Act, 21 U.S.C. section 360bbb-3(b)(1), unless the authorization is terminated or revoked.  Performed at [facility], [place] [address], [place], [place] [address]   Respiratory (~20 pathogens) panel by PCR     Status: None   Collection Time: 08/05/20 12:40 AM   Specimen: Nasopharyngeal Swab; Respiratory  Result Value Ref Range Status   Adenovirus NOT DETECTED NOT DETECTED Final   Coronavirus 229E NOT DETECTED NOT DETECTED Final    Comment: (NOTE) The Coronavirus on the Respiratory Panel, DOES NOT test for the novel  Coronavirus (2019 nCoV)    Coronavirus HKU1 NOT DETECTED NOT DETECTED Final   Coronavirus NL63 NOT DETECTED NOT DETECTED Final   Coronavirus OC43 NOT DETECTED NOT DETECTED Final   Metapneumovirus NOT DETECTED NOT DETECTED Final   Rhinovirus / Enterovirus NOT DETECTED NOT DETECTED Final   Influenza A NOT DETECTED NOT DETECTED Final   Influenza B NOT DETECTED NOT DETECTED Final   Parainfluenza Virus 1 NOT DETECTED NOT DETECTED Final   Parainfluenza Virus 2 NOT DETECTED NOT DETECTED Final   Parainfluenza Virus 3 NOT DETECTED NOT DETECTED Final   Parainfluenza Virus 4 NOT DETECTED NOT DETECTED Final   Respiratory Syncytial Virus NOT DETECTED NOT DETECTED Final   Bordetella pertussis NOT DETECTED NOT DETECTED Final   Bordetella Parapertussis NOT DETECTED NOT DETECTED Final   Chlamydophila pneumoniae NOT DETECTED NOT DETECTED Final   Mycoplasma pneumoniae NOT DETECTED NOT DETECTED Final    Comment:  Performed at [facility], [place]. [address], [place], [place] [address]  Expectorated Sputum Assessment w Gram Stain, Rflx to Resp Cult     Status: None   Collection Time: 08/06/20  1:00 AM   Specimen: Expectorated Sputum  Result Value Ref Range Status   Specimen Description EXPECTORATED SPUTUM  Final   Special Requests NONE  Final   Sputum evaluation   Final    Sputum specimen not acceptable for testing.  Please recollect.   RESULT CALLED TO, READ BACK BY AND VERIFIED WITH: CHRIS, RN @ 0236 ON 08/06/20 C VARNER Performed at  [facility], [place] [address] [place], [place] [address]    Report Status 08/06/2020 FINAL  Final  Expectorated Sputum Assessment w Gram Stain, Rflx to Resp Cult     Status: None   Collection Time: 08/06/20  3:33 PM   Specimen: Sputum  Result Value Ref Range Status   Specimen Description SPU  Final   Special Requests NONE  Final   Sputum evaluation   Final    Sputum specimen not acceptable for testing.  Please recollect.   Performed at [facility], [place] [address], [place], [place] [address]    Report Status 08/06/2020 FINAL  Final  MRSA PCR Screening     Status: None   Collection Time: 08/06/20  9:28 PM   Specimen: Nasopharyngeal  Result Value Ref Range Status   MRSA by PCR NEGATIVE NEGATIVE Final    Comment:        The GeneXpert MRSA Assay (FDA approved for NASAL specimens only), is one component of a comprehensive MRSA colonization surveillance program. It is not intended to diagnose MRSA infection nor to guide or monitor treatment for MRSA infections. Performed at [facility], [place] [address] [place], [place] [address]   Expectorated Sputum Assessment w Gram Stain, Rflx to Resp Cult     Status: None   Collection Time: 08/06/20 11:40 PM   Specimen: Expectorated Sputum  Result Value Ref Range Status   Specimen Description EXPECTORATED SPUTUM  Final   Special Requests NONE  Final   Sputum  evaluation   Final    THIS SPECIMEN IS ACCEPTABLE FOR SPUTUM CULTURE Performed at [facility], [place] [address], [place], [place] [address]    Report Status 08/06/2020 FINAL  Final  Culture, Respiratory w Gram Stain     Status: None (Preliminary result)   Collection Time: 08/06/20 11:40 PM  Result Value Ref Range Status   Specimen Description   Final    EXPECTORATED SPUTUM Performed at [place] [address] [place], [place] [address]    Special Requests   Final    NONE Reflexed from [id number] Performed at [facility], [place] [address] [place], [place] [address]    Gram Stain   Final    RARE SQUAMOUS EPITHELIAL CELLS PRESENT NO WBC SEEN FEW GRAM POSITIVE COCCI Performed at [facility], [place] [address], [place], [place] [address]    Culture PENDING  Incomplete   Report Status PENDING  Incomplete      Radiology Studies: DG CHEST PORT 1 VIEW  Result Date: 08/07/2020 CLINICAL DATA:  COVID positive. Shortness of breath. Respiratory failure. EXAM: PORTABLE CHEST 1 VIEW COMPARISON:  CT 08/28/2020.  Chest x-ray 08/07/2020. FINDINGS: Heart size stable. Progressive diffuse severe bilateral interstitial infiltrates are again noted. No pleural effusion or pneumothorax. No acute bony abnormality. IMPRESSION: Progressive diffuse severe bilateral interstitial infiltrates. Findings consistent with severe COVID pneumonia given patient's history. Electronically Signed   By: Marcello Moores  Register   On: 08/07/2020 06:36       LOS: 2 days   [facility] Pager on [email or web address]  08/07/2020, 9:57 AM

## 2020-08-08 ENCOUNTER — Telehealth: Payer: Self-pay | Admitting: Pulmonary Disease

## 2020-08-08 ENCOUNTER — Inpatient Hospital Stay (HOSPITAL_COMMUNITY): Payer: Medicare Other

## 2020-08-08 ENCOUNTER — Other Ambulatory Visit: Payer: Medicare Other

## 2020-08-08 DIAGNOSIS — I10 Essential (primary) hypertension: Secondary | ICD-10-CM

## 2020-08-08 DIAGNOSIS — J9601 Acute respiratory failure with hypoxia: Secondary | ICD-10-CM

## 2020-08-08 DIAGNOSIS — R739 Hyperglycemia, unspecified: Secondary | ICD-10-CM

## 2020-08-08 LAB — CYCLIC CITRUL PEPTIDE ANTIBODY, IGG/IGA: CCP Antibodies IgG/IgA: 8 units (ref 0–19)

## 2020-08-08 LAB — BASIC METABOLIC PANEL
Anion gap: 8 (ref 5–15)
BUN: 20 mg/dL (ref 8–23)
CO2: 29 mmol/L (ref 22–32)
Calcium: 9.2 mg/dL (ref 8.9–10.3)
Chloride: 99 mmol/L (ref 98–111)
Creatinine, Ser: 0.83 mg/dL (ref 0.61–1.24)
GFR, Estimated: >60 mL/min (ref >60)
Glucose, Bld: 138 mg/dL — ABNORMAL HIGH (ref 70–99)
Potassium: 4.7 mmol/L (ref 3.5–5.1)
Sodium: 136 mmol/L (ref 135–145)

## 2020-08-08 LAB — GLUCOSE, CAPILLARY
Glucose-Capillary: 115 mg/dL — ABNORMAL HIGH (ref 70–99)
Glucose-Capillary: 132 mg/dL — ABNORMAL HIGH (ref 70–99)
Glucose-Capillary: 136 mg/dL — ABNORMAL HIGH (ref 70–99)
Glucose-Capillary: 173 mg/dL — ABNORMAL HIGH (ref 70–99)
Glucose-Capillary: 175 mg/dL — ABNORMAL HIGH (ref 70–99)
Glucose-Capillary: 196 mg/dL — ABNORMAL HIGH (ref 70–99)
Glucose-Capillary: 216 mg/dL — ABNORMAL HIGH (ref 70–99)
Glucose-Capillary: 232 mg/dL — ABNORMAL HIGH (ref 70–99)

## 2020-08-08 LAB — CBC
HCT: 41 % (ref 39.0–52.0)
Hemoglobin: 13.3 g/dL (ref 13.0–17.0)
MCH: 29.2 pg (ref 26.0–34.0)
MCHC: 32.4 g/dL (ref 30.0–36.0)
MCV: 89.9 fL (ref 80.0–100.0)
Platelets: 196 10*3/uL (ref 150–400)
RBC: 4.56 MIL/uL (ref 4.22–5.81)
RDW: 14.7 % (ref 11.5–15.5)
WBC: 19.1 10*3/uL — ABNORMAL HIGH (ref 4.0–10.5)
nRBC: 0 % (ref 0.0–0.2)

## 2020-08-08 LAB — T4, FREE: Free T4: 0.99 ng/dL (ref 0.61–1.12)

## 2020-08-08 LAB — ANCA TITERS
Atypical P-ANCA titer: <1:20 {titer}
C-ANCA: <1:20 {titer}
P-ANCA: <1:20 {titer}

## 2020-08-08 LAB — PROCALCITONIN: Procalcitonin: 0.64 ng/mL

## 2020-08-08 MED ORDER — ENOXAPARIN SODIUM 40 MG/0.4ML IJ SOSY
40.0000 mg | PREFILLED_SYRINGE | INTRAMUSCULAR | Status: DC
  Administered 2020-08-09 – 2020-08-13 (×5): 40 mg via SUBCUTANEOUS
  Filled 2020-08-08 (×5): qty 0.4

## 2020-08-08 MED ORDER — FUROSEMIDE 10 MG/ML IJ SOLN
20.0000 mg | Freq: Once | INTRAMUSCULAR | Status: AC
  Administered 2020-08-08: 20 mg via INTRAVENOUS
  Filled 2020-08-08: qty 2

## 2020-08-08 MED ORDER — ENOXAPARIN SODIUM 40 MG/0.4ML IJ SOSY
40.0000 mg | PREFILLED_SYRINGE | Freq: Once | INTRAMUSCULAR | Status: AC
  Administered 2020-08-08: 40 mg via SUBCUTANEOUS
  Filled 2020-08-08: qty 0.4

## 2020-08-08 NOTE — Telephone Encounter (Signed)
Called and spoke with Nilsa.  Nilsa stated Patient is currently a Patient at [facility], and was told he will be in the hospital for several more days.  Nilsa stated they need O2 discontinued and picked up, because Patient is being billed by DME, and is not using O2 at home. Valentina Gu that Patient is admitted in the hospital and is using O2. Advised Patient may be discharged on O2 and if O2 has been picked up, Patient will have to be set up for O2 all over again.  Nilsa stated understanding, but insisted O2 be discontinued. DME [employer]     LOV 07/26/20- Dr. Wynona Neat   Instructions  DME referral for CPAP therapy Auto titrating CPAP 5-15   Prescription for chlorpromazine for his hiccups sent to pharmacy   Oxygen supplementation   I will see him back in 4 weeks, he should get a chest x-ray on the day of the visit      Message routed to Dr.Olalere to advise

## 2020-08-08 NOTE — Progress Notes (Signed)
Pt complained multiple times of mouth dryness while wearing bipap.  Pt switched back to 30L HHFC 100% fio2 and 15L NRB so mouth can be swabbed with water.  Pt does not want to go back on bipap at this time.  HR 78, rr31, spo2 95%.  Rn aware, present at bedside.  Bipap remains in room on standby.

## 2020-08-08 NOTE — TOC Progression Note (Signed)
Transition of Care [facility]) - Progression Note    Patient Details  Name: Rockie Schnoor MRN: [id number] Date of Birth: Oct 14, 1955  Transition of Care [facility]) CM/SW Contact  Golda Acre, RN Phone Number: 08/08/2020, 8:38 AM  Clinical Narrative:    65 y.o. male with medical history significant for  HTN, OSA not yet on CPAP who presented with progressive worsening shortness of breath and cough.  Patient was seen by pulmonology.  He had a negative COVID-19 test.  He has had several outpatient visits with pulmonology as well.  Has been on steroids and antibiotics.  Currently requiring high amounts of oxygen.  Now on BiPAP.   Reason for Visit: Acute respiratory failure with hypoxia   Consultants: Pulmonology   Procedures: None yet   Antibiotics:            Anti-infectives (From admission, onward)      Start     Dose/Rate Route Frequency Ordered Stop    08/07/20 0900   ceFEPIme (MAXIPIME) 2 g in sodium chloride 0.9 % 100 mL IVPB        2 g 200 mL/hr over 30 Minutes Intravenous Every 8 hours 08/07/20 0801             Subjective/Interval History: Patient's daughter is at the bedside who was able to interpret.  Patient denies any chest pain but has been having difficulty breathing.  Apparently his oxygen levels were low this morning and he was placed on BiPAP.  PLAN: from home plans to return to home, may ned home o2 will follow     Barriers to Discharge: Continued Medical Work up  Expected Discharge Plan and Services                                                 Social Determinants of Health (SDOH) Interventions    Readmission Risk Interventions No flowsheet data found.

## 2020-08-08 NOTE — Progress Notes (Addendum)
NAME:  Thomas Glass, MRN:  [id number], DOB:  28-Jun-1955, LOS: 3 ADMISSION DATE:  08/21/2020, CONSULTATION DATE:  08/08/20 REFERRING MD:  Arbutus Ped, CHIEF COMPLAINT:  Shortness of breath    History of Present Illness:  65 y.o. M with PMH of HTN and remote tobacco use along with hospitalization for PNA >60yr ago who was in his usual state of health until approximately 6 months ago when he began to develop shortness of breath, seen at pulmonary office with concern for OSA, did not start CPAP.   Then over the last two weeks developed progressive productive cough and exertional dyspnea.   He was seen in urgent care and PCP and prescribed Azithromycin, nebs and steroids without relief.  Cefdenir was added and pt still had persistent shortness of breath.   He followed up with pulmonology, oxygen was 82% on RA at the time and was send home on 2L home O2 and given steroid injection.  All Covid-19 tests have been negative.   Followed with pulm on 5/27 and plan to start CPAP and increase home O2 to 4L.   However over the last week pt's wife has noted increasing cough and shortness of breath to the point that he cannot walk short distances, so called EMS.    They report decreased appetite and chills, no measured fever.   In the ED, pt was initially hypoxic to 72% after running out of his home oxygen.   Oxygen improved with nasal cannula, work-up significant for WBC 15k.  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  Pt was admitted to the hospitalists and PCCM consulted.    Pt reports that he smoked 1-2 cigarettes per day, but quit over 111yrago.  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  No significant family history of lung disease and no known allergies, denies new pets or known mold exposure.    Pt speaks some English, seen with wife who felt comfortable translating as  needed.    Pertinent  Medical History  HTN  Significant Hospital Events: Including procedures, antibiotic start and stop dates in addition to other pertinent events   6/5 Admit to hospitalists 6/6 increasing O2 requirement, PCCM consult 6/8 Requiring 15L NRB + [place] O2  6/9 On heated high flow / NRB alternating with BiPAP, 1.9L negative with 20 mg lasix     Micro: 6/6 Respiratory viral panel >> negative 6/5 Covid-19 >> negative 6/5 Sputum culture >> 6/7 Strep Pneumo >> negative    Interim History / Subjective:  Pt reports breathing is better  Remains on heated high flow 30L / 100%, wore bipap overnight Daughter at bedside   Objective   Blood pressure 115/61, pulse 80, temperature 98 F (36.7 C), temperature source Axillary, resp. rate (!) 33, SpO2 93 %.    FiO2 (%):  [100 %] 100 %   Intake/Output Summary (Last 24 hours) at 08/08/2020 0841 Last data filed at 08/08/2020 0549 Gross per 24 hour  Intake 377.25 ml  Output 1900 ml  Net -1522.75 ml   There were no vitals filed for this visit.  General: adult male lying in bed in NAD HEENT: MM pink/moist, [place] O2 + NRB, anicteric  Neuro: AAOx4, speech clear, MAE  CV: s1s2 RRR, SR 80's on monitor, no m/r/g PULM: improved work of breathing, non-labored with faint bilateral crackles  GI: soft, bsx4 active  Extremities:  warm/dry, no edema  Skin: no rashes or lesions   Labs/imaging that I havepersonally reviewed  (right click and "Reselect all SmartList Selections" daily)  CBC - WBC 19.1, hgb 13.1  BMP - Na 136, K 4.7, BUN 20 Sr Cr 0.83  PCT - 0.64  PCXR - diffuse interstitial airspace disease  ECHO - LVEF 60-65%, Grade I DD  Resolved Hospital Problem list     Assessment & Plan:    Acute on Chronic Hypoxic Respiratory Failure Rule out ILD, Possible Acute Flare CT with diffuse alveolar damage, no known hx of Covid-19 infection. Treated with multiple courses of antibiotics and steroids without improvement. CRP elevated,  sed rate mildly up, ANCA titers pending. Viral panel negative. Minimal eosinophils on differential on admit. Elevated RF 17.8 / ESR / CRP.   -remains high risk for respiratory decompensation  -wean O2 for sats >88% at rest, >85% with exertion -anticipate desaturations with exertion and slow recovery  -continue heated high flow O2 with NRB mixed with BiPAP for support  -pulmonary  hygiene -IS, mobilize  -KVO IVF -repeat lasix 20 mg IV x1 -continue steroids, 40 mg BID  -empiric cefepime, D2/x  -assess COVID antibodies  -follow respiratory culture -await HSP, cytology, ANCA -continue duoneb Q4, pulmicort BID   Exophthalmus  TSH  -await free T4  DM Hgb A1c 7.3 on admit, new dx + component of steroid induced hyperglycemia  -SSI   HTN -hold losartan  -monitor BP trend   Obesity  BMI 30 -diet counseling when clinically improved   Best practice (right click and "Reselect all SmartList Selections" daily)  Diet: as tolerated  Glucose: SSI  DVT: lovenox GOC: Discussed concept again with patient regarding intubation (no current indication) and he states he does not want.  During conversation, it appears he is saying he doesn't want it because he thinks we are offering it in that moment.  Family present for conversation.  RN assisted with translation.  Will continue full code status.  Support offered.    Critical care time: 62 minutes      Noe Gens, MSN, APRN, NP-C, AGACNP-BC [facility] 08/08/2020, 8:41 AM   Please see [email or web address] for pager details.   From 7A-7P if no response, please call [phone] After hours, please call ELink [phone]

## 2020-08-08 NOTE — Progress Notes (Signed)
Pt seen and given scheduled nebulizer treatment.  HR78, rr36, spo2 92%.  Pt wishes to stay off bipap, remains on 30L HHFNC 100% and 15L NRB.  Bipap remains in room on standby.

## 2020-08-08 NOTE — Progress Notes (Signed)
CH visited pt. per referral from attending MD to assess pt./family needs.  Pt. sitting up in bed wearing HFNC; dtr. Toney Reil and wife Camila Li present at bedside.  Pt. able to speak though would get winded quickly; pt. and family shared that pt.'s breathing has been good during the day, but that when he receives several medications in the evening he has been having trouble breathing and becomes 'anxious because I can't breathe'.  CH relayed this info to attending MD; dtr. is eldest of five and has been staying with pt. overnight as support and advocate for him.  Family expresses no specific needs at this time but was grateful for visit.  Chaplains remain available as needed via page.  Elpidio Anis PRN Chaplain Pager: [phone]

## 2020-08-09 ENCOUNTER — Inpatient Hospital Stay (HOSPITAL_COMMUNITY): Payer: Medicare Other

## 2020-08-09 ENCOUNTER — Other Ambulatory Visit: Payer: Self-pay | Admitting: Pulmonary Disease

## 2020-08-09 LAB — CULTURE, RESPIRATORY W GRAM STAIN: Culture: NORMAL

## 2020-08-09 LAB — HYPERSENSITIVITY PNEUMONITIS
A. Pullulans Abs: NEGATIVE
A.Fumigatus #1 Abs: NEGATIVE
Micropolyspora faeni, IgG: NEGATIVE
Pigeon Serum Abs: NEGATIVE
Thermoact. Saccharii: NEGATIVE
Thermoactinomyces vulgaris, IgG: NEGATIVE

## 2020-08-09 LAB — GLUCOSE, CAPILLARY
Glucose-Capillary: 132 mg/dL — ABNORMAL HIGH (ref 70–99)
Glucose-Capillary: 170 mg/dL — ABNORMAL HIGH (ref 70–99)
Glucose-Capillary: 139 mg/dL — ABNORMAL HIGH (ref 70–99)
Glucose-Capillary: 131 mg/dL — ABNORMAL HIGH (ref 70–99)
Glucose-Capillary: 136 mg/dL — ABNORMAL HIGH (ref 70–99)

## 2020-08-09 LAB — SAR COV2 SEROLOGY (COVID19)AB(IGG),IA: SARS-CoV-2 Ab, IgG: NONREACTIVE

## 2020-08-09 LAB — BASIC METABOLIC PANEL
Anion gap: 7 (ref 5–15)
BUN: 22 mg/dL (ref 8–23)
CO2: 28 mmol/L (ref 22–32)
Calcium: 9.1 mg/dL (ref 8.9–10.3)
Chloride: 100 mmol/L (ref 98–111)
Creatinine, Ser: 0.57 mg/dL — ABNORMAL LOW (ref 0.61–1.24)
GFR, Estimated: >60 mL/min (ref >60)
Glucose, Bld: 91 mg/dL (ref 70–99)
Potassium: 4.5 mmol/L (ref 3.5–5.1)
Sodium: 135 mmol/L (ref 135–145)

## 2020-08-09 LAB — CBC
HCT: 39.4 % (ref 39.0–52.0)
Hemoglobin: 13 g/dL (ref 13.0–17.0)
MCH: 29.3 pg (ref 26.0–34.0)
MCHC: 33 g/dL (ref 30.0–36.0)
MCV: 88.9 fL (ref 80.0–100.0)
Platelets: 190 10*3/uL (ref 150–400)
RBC: 4.43 MIL/uL (ref 4.22–5.81)
RDW: 14.4 % (ref 11.5–15.5)
WBC: 17.7 10*3/uL — ABNORMAL HIGH (ref 4.0–10.5)
nRBC: 0 % (ref 0.0–0.2)

## 2020-08-09 LAB — PROCALCITONIN: Procalcitonin: 0.47 ng/mL

## 2020-08-09 MED ORDER — DEXMEDETOMIDINE HCL IN NACL 200 MCG/50ML IV SOLN
0.2000 ug/kg/h | INTRAVENOUS | Status: DC
  Administered 2020-08-09 (×2): 0.5 ug/kg/h via INTRAVENOUS
  Administered 2020-08-09: 0.6 ug/kg/h via INTRAVENOUS
  Administered 2020-08-09: 0.5 ug/kg/h via INTRAVENOUS
  Administered 2020-08-09: 0.4 ug/kg/h via INTRAVENOUS
  Administered 2020-08-10: 1.1 ug/kg/h via INTRAVENOUS
  Administered 2020-08-10: 0.8 ug/kg/h via INTRAVENOUS
  Administered 2020-08-10 (×2): 1.1 ug/kg/h via INTRAVENOUS
  Administered 2020-08-10: 0.8 ug/kg/h via INTRAVENOUS
  Administered 2020-08-10: 1.1 ug/kg/h via INTRAVENOUS
  Administered 2020-08-10: 0.8 ug/kg/h via INTRAVENOUS
  Administered 2020-08-10: 0.9 ug/kg/h via INTRAVENOUS
  Administered 2020-08-11: 1.1 ug/kg/h via INTRAVENOUS
  Administered 2020-08-11: 0.9 ug/kg/h via INTRAVENOUS
  Administered 2020-08-11 (×2): 0.7 ug/kg/h via INTRAVENOUS
  Administered 2020-08-11: 1.1 ug/kg/h via INTRAVENOUS
  Administered 2020-08-11 (×2): 0.7 ug/kg/h via INTRAVENOUS
  Administered 2020-08-11: 1 ug/kg/h via INTRAVENOUS
  Administered 2020-08-11 – 2020-08-12 (×2): 0.7 ug/kg/h via INTRAVENOUS
  Administered 2020-08-12: 0.6 ug/kg/h via INTRAVENOUS
  Administered 2020-08-12: 0.5 ug/kg/h via INTRAVENOUS
  Administered 2020-08-12: 0.6 ug/kg/h via INTRAVENOUS
  Administered 2020-08-12: 0.5 ug/kg/h via INTRAVENOUS
  Administered 2020-08-12 – 2020-08-14 (×8): 0.7 ug/kg/h via INTRAVENOUS
  Administered 2020-08-14: 0.3 ug/kg/h via INTRAVENOUS
  Filled 2020-08-09: qty 50
  Filled 2020-08-09: qty 100
  Filled 2020-08-09 (×21): qty 50
  Filled 2020-08-09: qty 100
  Filled 2020-08-09 (×10): qty 50

## 2020-08-09 NOTE — Progress Notes (Signed)
eLink Physician-Brief Progress Note Patient Name: Thomas Glass DOB: 04-18-1955 MRN: [id number]   Date of Service  08/09/2020  HPI/Events of Note  Patient with high anxiety  on BIPAP which he desperately needs, to try to prevent intubation.  eICU Interventions  Precedex ordered for mild sedation and anxiolysis.        [name] 08/09/2020, 12:57 AM

## 2020-08-09 NOTE — Progress Notes (Signed)
Patient's wife updated at bedside on plan of care.     Canary Brim, MSN, APRN, NP-C, AGACNP-BC [facility] 08/09/2020, 5:00 PM   Please see [email or web address] for pager details.   From 7A-7P if no response, please call [phone] After hours, please call ELink [phone]

## 2020-08-09 NOTE — Progress Notes (Signed)
PCCM Progress Note  Family meeting held with patient, wife, daughters and son. Video translator was also present. I addressed questions regarding treatment options (oxygen, mechanical ventilator, bronchoscopy and transplant candidacy). After discussion, family expressed understanding that he is not a transplant candidate and could only be evaluated once he is stable enough to be discharged from the hospital, which at this time our prognosis is guarded. Family understands that he is in critical condition and will likely need mechanical ventilation for support if he worsens. If he does end up on the ventilator, they understand that if he does not improve this will likely mean end of life.   Plan: Continue oxygen support, steroids and diuresis  Agree with mechanical ventilation when indicated Remain full code  Care Time: 60 minutes  Mechele Collin, M.D. [facility] 08/09/2020 5:18 PM

## 2020-08-09 NOTE — Progress Notes (Signed)
Pt has agreed to wear BIPAP for a few hours. Pt has been made aware of their need for oxygen and to notify nurse when he is ready to remove BIPAP.

## 2020-08-09 NOTE — Progress Notes (Addendum)
 NAME:  Thomas Glass, MRN:  [id number], DOB:  05/23/1955, LOS: 4 ADMISSION DATE:  08/21/2020, CONSULTATION DATE:  08/09/20 REFERRING MD:  Griffith, CHIEF COMPLAINT:  Shortness of breath    History of Present Illness:  64 y.o. M with PMH of HTN and remote tobacco use along with hospitalization for PNA >20yrs ago who was in his usual state of health until approximately 6 months ago when he began to develop shortness of breath, seen at pulmonary office with concern for OSA, did not start CPAP.   Then over the last two weeks developed progressive productive cough and exertional dyspnea.   He was seen in urgent care and PCP and prescribed Azithromycin, nebs and steroids without relief.  Cefdenir was added and pt still had persistent shortness of breath.   He followed up with pulmonology, oxygen was 82% on RA at the time and was send home on 2L home O2 and given steroid injection.  All Covid-19 tests have been negative.   Followed with pulm on 5/27 and plan to start CPAP and increase home O2 to 4L.   However over the last week pt's wife has noted increasing cough and shortness of breath to the point that he cannot walk short distances, so called EMS. They report decreased appetite and chills, no measured fever.  In the ED, pt was initially hypoxic to 72% after running out of his home oxygen.   Oxygen improved with nasal cannula, work-up significant for WBC 15k.  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  Pt was admitted to the hospitalists and PCCM consulted.   Pt reports that he smoked 1-2 cigarettes per day, but quit over 10yrs ago.  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  No significant family history of lung disease and no known allergies, denies new pets or known mold exposure.    Pt speaks some English, seen with wife who felt comfortable translating as  needed.    Pertinent  Medical History  HTN  Significant Hospital Events: Including procedures, antibiotic start and stop dates in addition to other pertinent events   6/5 Admit to hospitalists 6/6 increasing O2 requirement, PCCM consult 6/8 Requiring 15L NRB + [place] O2  6/9 On heated high flow / NRB alternating with BiPAP, 1.9L negative with 20 mg lasix  6/10 On 45L / 100% HHFNC + NRB.  Precedex started for anxiety while on BiPAP    Micro: 6/6 Respiratory viral panel >> negative 6/5 Covid-19 >> negative 6/5 Sputum culture >> 6/7 Strep Pneumo >> negative  Interim History / Subjective:  Daughter at bedside, pt reports feeling tired  Precedex started overnight for anxiety while on bipap Afebrile  I/O 1.8L UOP, -327ml for last 24 hours Glucose range 91-170  Objective   Blood pressure 106/71, pulse 65, temperature 98.8 F (37.1 C), temperature source Axillary, resp. rate (!) 30, SpO2 93 %.    FiO2 (%):  [100 %] 100 %   Intake/Output Summary (Last 24 hours) at 08/09/2020 0841 Last data filed at 08/09/2020 0700 Gross per 24 hour  Intake 1472.4 ml  Output 1800 ml  Net -327.6 ml   There were no vitals filed for this visit.  General: ill appearing adult male lying in bed in NAD HEENT: MM pink/moist, [place] + NRB O2, anicteric Neuro: AAOx4, speech clear, MAE CV: s1s2 RRR, no m/r/g PULM: tachypnea but no   distress, lungs bilaterally with dry inspiratory crackles GI: soft, bsx4 active  Extremities: warm/dry, no edema  Skin: no rashes or lesions   Labs/imaging that I havepersonally reviewed  (right click and "Reselect all SmartList Selections" daily)  CBC - WBC 19.1, hgb 13.1  BMP - Na 136, K 4.7, BUN 20 Sr Cr 0.83  PCT - 0.64  PCXR - diffuse interstitial airspace disease  ECHO - LVEF 60-65%, Grade I DD  Resolved Hospital Problem list     Assessment & Plan:    Acute on Chronic Hypoxic Respiratory Failure Rule out ILD, Possible Acute Flare CT with diffuse alveolar damage,  no known hx of Covid-19 infection. Treated with multiple courses of antibiotics and steroids without improvement. CRP elevated, sed rate mildly up. Viral panel, ANCA negative. Minimal eosinophils on differential on admit. Elevated RF 17.8 / ESR / CRP. COVID antibodies negative.   -continues to be high risk for respiratory decompensation.  -wean O2 for sats >88% at rest, >85% with exertion  -anticipate he will have quick periods of desaturation and slow recovery -Heated high flow & alternate with BiPAP  -push pulmonary hygiene -IS, mobilize  -hold lasix 6/10 -solumedrol 40 mg IV BID  -continue cefepime D3/x -await final respiratory cultures, HSP, sputum for eosinophils -continue duoneb Q4, pulmicort BID -follow intermittent CXR   Anxiety -precedex infusion for comfort  -avoid oversedation  Exophthalmus  TSH 0.232, normal T4 -hold acute interventions while critically ill -repeat labs as outpatient   DM Hgb A1c 7.3 on admit, new dx + component of steroid induced hyperglycemia  -SSI   HTN -hold losartan  -monitor BP trend   Obesity  BMI 30 -diet counseling when clinically improved  Best practice (right click and "Reselect all SmartList Selections" daily)  Diet: as tolerated  Glucose: SSI  DVT: lovenox Code Status: Full code    Critical care time: 31 minutes      Noe Gens, MSN, APRN, NP-C, AGACNP-BC [place] [facility] 08/09/2020, 8:41 AM   Please see [email or web address] for pager details.   From 7A-7P if no response, please call [phone] After hours, please call ELink [phone]

## 2020-08-10 LAB — CBC
HCT: 39.9 % (ref 39.0–52.0)
Hemoglobin: 13.3 g/dL (ref 13.0–17.0)
MCH: 29.4 pg (ref 26.0–34.0)
MCHC: 33.3 g/dL (ref 30.0–36.0)
MCV: 88.3 fL (ref 80.0–100.0)
Platelets: 181 10*3/uL (ref 150–400)
RBC: 4.52 MIL/uL (ref 4.22–5.81)
RDW: 14.2 % (ref 11.5–15.5)
WBC: 15.9 10*3/uL — ABNORMAL HIGH (ref 4.0–10.5)
nRBC: 0 % (ref 0.0–0.2)

## 2020-08-10 LAB — BASIC METABOLIC PANEL
Anion gap: 9 (ref 5–15)
BUN: 28 mg/dL — ABNORMAL HIGH (ref 8–23)
CO2: 25 mmol/L (ref 22–32)
Calcium: 9.1 mg/dL (ref 8.9–10.3)
Chloride: 104 mmol/L (ref 98–111)
Creatinine, Ser: 0.53 mg/dL — ABNORMAL LOW (ref 0.61–1.24)
GFR, Estimated: >60 mL/min (ref >60)
Glucose, Bld: 93 mg/dL (ref 70–99)
Potassium: 4.7 mmol/L (ref 3.5–5.1)
Sodium: 138 mmol/L (ref 135–145)

## 2020-08-10 LAB — LACTATE DEHYDROGENASE: LDH: 408 U/L — ABNORMAL HIGH (ref 98–192)

## 2020-08-10 LAB — C-REACTIVE PROTEIN: CRP: 13.1 mg/dL — ABNORMAL HIGH (ref <1.0)

## 2020-08-10 LAB — PROCALCITONIN: Procalcitonin: 0.2 ng/mL

## 2020-08-10 LAB — GLUCOSE, CAPILLARY
Glucose-Capillary: 119 mg/dL — ABNORMAL HIGH (ref 70–99)
Glucose-Capillary: 120 mg/dL — ABNORMAL HIGH (ref 70–99)
Glucose-Capillary: 145 mg/dL — ABNORMAL HIGH (ref 70–99)
Glucose-Capillary: 149 mg/dL — ABNORMAL HIGH (ref 70–99)
Glucose-Capillary: 170 mg/dL — ABNORMAL HIGH (ref 70–99)
Glucose-Capillary: 95 mg/dL (ref 70–99)

## 2020-08-10 LAB — BRAIN NATRIURETIC PEPTIDE: B Natriuretic Peptide: 118.2 pg/mL — ABNORMAL HIGH (ref 0.0–100.0)

## 2020-08-10 LAB — CK: Total CK: 82 U/L (ref 49–397)

## 2020-08-10 MED ORDER — ACETAMINOPHEN 325 MG PO TABS: 650.0000 mg | ORAL_TABLET | Freq: Four times a day (QID) | ORAL | Status: DC | PRN

## 2020-08-10 MED ORDER — ACETAMINOPHEN 650 MG RE SUPP: 650.0000 mg | Freq: Four times a day (QID) | RECTAL | Status: DC | PRN

## 2020-08-10 MED ORDER — MORPHINE 100MG IN NS 100ML (1MG/ML) PREMIX INFUSION
0.0000 mg/h | INTRAVENOUS | Status: DC
  Administered 2020-08-10: 5 mg/h via INTRAVENOUS
  Administered 2020-08-11: 2.5 mg/h via INTRAVENOUS
  Administered 2020-08-12 – 2020-08-13 (×3): 5 mg/h via INTRAVENOUS
  Administered 2020-08-14: 15 mg/h via INTRAVENOUS
  Administered 2020-08-14: 10 mg/h via INTRAVENOUS
  Administered 2020-08-15: 17.5 mg/h via INTRAVENOUS
  Administered 2020-08-15: 20 mg/h via INTRAVENOUS
  Filled 2020-08-10 (×12): qty 100

## 2020-08-10 MED ORDER — MORPHINE SULFATE (PF) 2 MG/ML IV SOLN
2.0000 mg | INTRAVENOUS | Status: DC | PRN
  Administered 2020-08-10 (×2): 2 mg via INTRAVENOUS
  Filled 2020-08-10 (×2): qty 1

## 2020-08-10 MED ORDER — MORPHINE BOLUS VIA INFUSION
5.0000 mg | INTRAVENOUS | Status: DC | PRN
  Administered 2020-08-14 – 2020-08-15 (×8): 5 mg via INTRAVENOUS
  Filled 2020-08-10: qty 5

## 2020-08-10 MED ORDER — DEXTROSE 5 % IV SOLN: INTRAVENOUS | Status: DC

## 2020-08-10 MED ORDER — HALOPERIDOL LACTATE 5 MG/ML IJ SOLN
2.5000 mg | INTRAMUSCULAR | Status: DC | PRN
Start: 2020-08-10 — End: 2020-08-15

## 2020-08-10 MED ORDER — SODIUM CHLORIDE 0.9 % IV SOLN
500.0000 mg | INTRAVENOUS | Status: DC
  Administered 2020-08-10 – 2020-08-13 (×4): 500 mg via INTRAVENOUS
  Filled 2020-08-10 (×5): qty 500

## 2020-08-10 MED ORDER — MORPHINE SULFATE (PF) 2 MG/ML IV SOLN
2.0000 mg | INTRAVENOUS | Status: DC | PRN
  Administered 2020-08-15: 2 mg via INTRAVENOUS
  Filled 2020-08-10: qty 1

## 2020-08-10 MED ORDER — GLYCOPYRROLATE 0.2 MG/ML IJ SOLN: 0.2000 mg | INTRAMUSCULAR | Status: DC | PRN

## 2020-08-10 MED ORDER — DIPHENHYDRAMINE HCL 50 MG/ML IJ SOLN: 25.0000 mg | INTRAMUSCULAR | Status: DC | PRN

## 2020-08-10 MED ORDER — GLYCOPYRROLATE 1 MG PO TABS: 1.0000 mg | ORAL_TABLET | ORAL | Status: DC | PRN

## 2020-08-10 MED ORDER — POLYVINYL ALCOHOL 1.4 % OP SOLN
1.0000 [drp] | Freq: Four times a day (QID) | OPHTHALMIC | Status: DC | PRN
  Administered 2020-08-14: 1 [drp] via OPHTHALMIC
  Filled 2020-08-10 (×2): qty 15

## 2020-08-10 MED ORDER — METHYLPREDNISOLONE SODIUM SUCC 125 MG IJ SOLR
60.0000 mg | Freq: Four times a day (QID) | INTRAMUSCULAR | Status: DC
  Administered 2020-08-10 – 2020-08-13 (×13): 60 mg via INTRAVENOUS
  Filled 2020-08-10 (×12): qty 2

## 2020-08-10 NOTE — Progress Notes (Addendum)
Pt had taken off the BIPAP again and SATs were at 82% on 50L100%HFNC + 15L NRB. Pt stated that he felt like he wasn't getting any air while on BIPAP. This RT explained the importance of wearing the BIPAP and increased EPAP to +8 for pt comfort. PT SpO2 is currently 91% on BIPAP 15/8 100%.

## 2020-08-10 NOTE — Progress Notes (Signed)
Chaplain called back to hospital to speak once again with family.  Mr. Thomas Glass wants to marry the mother of his children (I mistakenly thought they WERE married).  I explained to pt's daughter the steps that would be required for a marriage to take place here in hospital which could not begin until govt offices are open on Monday.  Chaplain spoke with pt's nurse and we're all on the same page about this request.  Chaplain will place referral for Monday Chaplain to help facilitate this process.  Thomas Glass Chaplain

## 2020-08-10 NOTE — Progress Notes (Signed)
Pharmacy Antibiotic Note  Thomas Glass is a 65 y.o. male admitted on 08/22/2020 with acute on chronic respiratory failure. Recent course of azithromycin and cefdinir  Pharmacy has been consulted for cefepime dosing.  08/10/2020 D# 4 cefepime D#1/5 azithromycin per MD AF, WBC 15.9 (down trend), SCr 0.53, PCT down, CRP down,   Plan: Continue Cefepime 2 g iv q 8 hours- ? Add stop date Azithromycin per MD x 5 days Will f/u renal function, culture results, and plans for antibiotics     Temp (24hrs), Avg:97.6 F (36.4 C), Min:97.1 F (36.2 C), Max:98.2 F (36.8 C)  Recent Labs  Lab 08/06/20 0433 08/07/20 0233 08/08/20 0236 08/09/20 0238 08/10/20 0221  WBC 21.9* 22.3* 19.1* 17.7* 15.9*  CREATININE 0.94 0.84 0.83 0.57* 0.53*     CrCl cannot be calculated (Unknown ideal weight.).    No Known Allergies   Antimicrobials this admission: 6/8 cefepime >>  6/11 azith>> (6/15) Dose adjustments this admission:  Microbiology results: 6/5 COVID/Flu: neg/neg 6/6 resp panel: neg 6/7 MRSA PCR: neg 6/7 sputum Cx: normal flora F 6/8 legionella neg 6/7 strep pneumo neg 6/7 HIV NR Covid antibodies negative ANCA neg  Thank you for allowing pharmacy to be a part of this patient's care.  Herby Abraham, Pharm.D 08/10/2020 10:32 AM

## 2020-08-10 NOTE — Progress Notes (Signed)
Spoke to Dr. Francine Graven regarding pt's breathing. Per MD order, keep pt's sats >85%. If pt drops or increased in breathing toplace pt on bipap.

## 2020-08-10 NOTE — Progress Notes (Signed)
NAME:  Thomas Glass, MRN:  [id number], DOB:  [birth year], LOS: 5 ADMISSION DATE:  08/14/2020, CONSULTATION DATE:  08/10/20 REFERRING MD:  Arbutus Ped, CHIEF COMPLAINT:  Shortness of breath    History of Present Illness:  65 y.o. M with PMH of HTN and remote tobacco use along with hospitalization for PNA >23yr ago who was in his usual state of health until approximately 6 months ago when he began to develop shortness of breath, seen at pulmonary office with concern for OSA, did not start CPAP.   Then over the last two weeks developed progressive productive cough and exertional dyspnea.   He was seen in urgent care and PCP and prescribed Azithromycin, nebs and steroids without relief.  Cefdenir was added and pt still had persistent shortness of breath.   He followed up with pulmonology, oxygen was 82% on RA at the time and was send home on 2L home O2 and given steroid injection.  All Covid-19 tests have been negative.   Followed with pulm on 5/27 and plan to start CPAP and increase home O2 to 4L.   However over the last week pt's wife has noted increasing cough and shortness of breath to the point that he cannot walk short distances, so called EMS. They report decreased appetite and chills, no measured fever.  In the ED, pt was initially hypoxic to 72% after running out of his home oxygen.   Oxygen improved with nasal cannula, work-up significant for WBC 15k.  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  Pt was admitted to the hospitalists and PCCM consulted.   Pt reports that he smoked 1-2 cigarettes per day, but quit over 1105yrago.  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  No significant family history of lung disease and no known allergies, denies new pets or known mold exposure.    Pt speaks some English, seen with wife who felt comfortable translating as  needed.    Pertinent  Medical History  HTN  Significant Hospital Events: Including procedures, antibiotic start and stop dates in addition to other pertinent events   6/5 Admit to hospitalists 6/6 increasing O2 requirement, PCCM consult 6/8 Requiring 15L NRB + [place] O2  6/9 On heated high flow / NRB alternating with BiPAP, 1.9L negative with 20 mg lasix  6/10 On 45L / 100% HHFNC + NRB.  Precedex started for anxiety while on BiPAP    Micro: 6/6 Extended Respiratory viral panel >> negative 6/5 Covid-19 >> negative 6/5 Sputum culture >> 6/7 Strep Pneumo >> negative 6/7 Legionella Ur Ag >> negative  Interim History / Subjective:   No acute events overnight. Patient remains on intermittent HHFNC and Bipap. He does not tolerate the Bipap well while awake. He is on precedex for anxiety.   Discussed patient's progressive respiratory failure with the family. I expressed my concern for an interstitial lung disease related to his previous work. They have asked if he would be a candidate for a lung transplant and I told them [facility]  Objective   Blood pressure 135/82, pulse 66, temperature 98.2 F (36.8 C), temperature source Axillary, resp. rate (!) 45, SpO2 (!) 86 %.    FiO2 (%):  [100 %] 100 %   Intake/Output Summary (Last 24 hours) at 08/10/2020 0843 Last data filed at 08/10/2020 0832 Gross per 24 hour  Intake 768.95 ml  Output 1710 ml  Net -941.05 ml   There were no vitals filed for this visit.  General: ill appearing adult male lying in bed in mild respiratory distress HEENT: [place]/AT, bipap in place,  Neuro: AAOx4, moving all extremities CV: s1s2 RRR, no m/r/g PULM: tachypnea, accessory muscle use present, mild distress, lungs bilaterally with dry inspiratory crackles GI: soft, bsx4 active  Extremities: warm/dry, no edema  Skin: no rashes or lesions   Labs/imaging that I havepersonally reviewed  (right click and "Reselect all SmartList  Selections" daily)  CBC - WBC 15.9, hgb 13.3 BMP - Na 138, K 4.7, BUN 28 Sr Cr 0.53  PCT - 0.64 > 0.47 PCXR - diffuse interstitial airspace disease  ECHO - LVEF 60-65%, Grade I DD  CRP 15.3 >13.1 BNP 45.5 HP Panel >> Negative   Procalcitonin 0.2  LDH 408 BNP 118.2 CK 82  Resolved Hospital Problem list     Assessment & Plan:    Acute on Chronic Hypoxic Respiratory Failure Concern for occupational related ILD with acute flare CT with diffuse alveolar damage, no known hx of Covid-19 infection. Treated with multiple courses of antibiotics and steroids without improvement. CRP elevated, sed rate mildly up. Viral panel, ANCA negative. Minimal eosinophils on differential on admit. Elevated RF 17.8 / ESR / CRP. COVID antibodies negative. Hypersensitivity panel is negative. Urine strep and legionella ag are negative. Extended viral panel is negative.   -continues to be high risk for respiratory decompensation. Holding off on intubation at this time. I have contacted the [facility] transfer line for possible transfer for inpatient lung transplant evaluation.  -wean O2 for sats >88% at rest, >85% with exertion  -anticipate he will have quick periods of desaturation and slow recovery -Heated high flow & alternate with BiPAP  -push pulmonary hygiene -IS, mobilize  -increase solumedrol 60 mg IV QID today  -continue cefepime and will add azithromycin. MRSA screen negative. -continue duoneb Q4, pulmicort BID -follow intermittent CXR   Anxiety -precedex infusion for comfort  -avoid oversedation  DM Hgb A1c 7.3 on admit, new dx + component of steroid induced hyperglycemia  -SSI   HTN -hold losartan  -monitor BP trend   Obesity  BMI 30  Best practice (right click and "Reselect all SmartList Selections" daily)  Diet:  NPO Pain/Anxiety/Delirium protocol (if indicated): precedex ordered, PRN morphine VAP protocol (if indicated): n/a DVT prophylaxis: LMWH GI prophylaxis: PPI Glucose  control:  SSI Yes Central venous access:  N/A Arterial line:  N/A Foley:  N/A Mobility:  bed rest  PT consulted: N/A Last date of multidisciplinary goals of care discussion [6/11 with patient's wife, 2 daughters and son. They would like to see if he can be transferred to [facility] for lung transplant evaluation] Code Status:  full code Disposition: ICU   Critical care time:  80 minutes      Freda Jackson, MD [place]: [phone]   See Amion for personal pager PCCM on call pager [phone] until 7pm. Please call Elink 7p-7a. [phone]

## 2020-08-10 NOTE — Progress Notes (Signed)
eLink Physician-Brief Progress Note Patient Name: Thomas Glass DOB: March 04, 1955 MRN: [id number]   Date of Service  08/10/2020  HPI/Events of Note  E-Link patient follow up.  eICU Interventions  Patient is tolerating BIPAP well so far.        Thomasene Lot Inayah Woodin 08/10/2020, 6:54 AM

## 2020-08-10 NOTE — Progress Notes (Signed)
Responding to page requesting spiritual care for Thomas Glass family, Thomas Glass found his wife crying in the hallway outside the pt's room.  Chaplain offered comfort; Thomas Glass too distraught to reply.  Her daughter was there and relayed her mother would like to have some prayers offered for their dad.  The family gathered at bedside and joined hands in a circle while Chaplain offered prayers and readings from the [employer] rite at the time of death.  Mr. Thomas Glass was alert and indicated he understood what was being said.  He confirmed he would like Chaplain to pray for the forgiveness of our sins.  The family joined in reciting the [facility]. Chaplain read from the [place] of Jonny Ruiz along with additional prayers for the family.  Thomas Glass was appreciative and seemed much calmer after the prayers and the recitation of the Rite.    Chaplain Glass available to offer additional spiritual care as needed.  Thomas Glass Chaplain

## 2020-08-10 NOTE — Progress Notes (Signed)
PCCM Progress Note:  I spoke with Dr. Merton Border with the [facility] lung transplant service and patient was deemed not a candidate for transfer for inpatient lung transplant evaluation.  This was based on his dependence on BiPAP and inability to ambulate at this time due to significant oxygen requirements. This was also based on his advanced interstitial lung disease noted on chest imaging.   I had a long discussion with the family via interpreter services and they understand that we do not have any other good treatment options at this time for him.  We will transition to comfort care and he will be made DNR/DNI.  I have placed a consult for spiritual care.  Melody Comas, MD [facility] Office: [phone]   See Amion for personal pager PCCM on call pager [phone] until 7pm. Please call Elink 7p-7a. [phone]

## 2020-08-11 LAB — GLUCOSE, CAPILLARY
Glucose-Capillary: 150 mg/dL — ABNORMAL HIGH (ref 70–99)
Glucose-Capillary: 158 mg/dL — ABNORMAL HIGH (ref 70–99)
Glucose-Capillary: 169 mg/dL — ABNORMAL HIGH (ref 70–99)
Glucose-Capillary: 177 mg/dL — ABNORMAL HIGH (ref 70–99)
Glucose-Capillary: 180 mg/dL — ABNORMAL HIGH (ref 70–99)
Glucose-Capillary: 185 mg/dL — ABNORMAL HIGH (ref 70–99)

## 2020-08-11 LAB — ALDOLASE: Aldolase: 23.3 U/L — ABNORMAL HIGH (ref 3.3–10.3)

## 2020-08-11 MED ORDER — THIAMINE HCL 100 MG/ML IJ SOLN
Freq: Once | INTRAVENOUS | Status: AC
  Filled 2020-08-11: qty 1000

## 2020-08-11 MED ORDER — IPRATROPIUM-ALBUTEROL 0.5-2.5 (3) MG/3ML IN SOLN
3.0000 mL | Freq: Three times a day (TID) | RESPIRATORY_TRACT | Status: DC
  Administered 2020-08-12: 3 mL via RESPIRATORY_TRACT
  Filled 2020-08-11: qty 3

## 2020-08-11 NOTE — Progress Notes (Signed)
RT spoke to the Pt's daughter and explained to them that the Pt cant drink or eat while he is on the BIPAP that it could cause him to aspirate. I also stated that he was really sink and and wasn't sure if vitamins would help but they should do what they feel is right for them. The nurse explained that she would have to start another IV. I explained that I had lost my husband and that their dad was a very strong man and so nice. I knew they had been praying and said all you can do is pray and I do believe in miracles. The family is so nice. I also explained that I tried to put the heated high flow [place] at the highest settings so he could talk to his grandson but it wasn't enough support. RT will continue to monitor.

## 2020-08-11 NOTE — Progress Notes (Signed)
Pt is on BIPAP. HHFNC stand by.

## 2020-08-11 NOTE — Progress Notes (Addendum)
NAME:  Thomas Glass, MRN:  [id number], DOB:  [birth year], LOS: 6 ADMISSION DATE:  08/28/2020, CONSULTATION DATE:  08/11/20 REFERRING MD:  Arbutus Ped, CHIEF COMPLAINT:  Shortness of breath    History of Present Illness:  65 y.o. M with PMH of HTN and remote tobacco use along with hospitalization for PNA >53yr ago who was in his usual state of health until approximately 6 months ago when he began to develop shortness of breath, seen at pulmonary office with concern for OSA, did not start CPAP.   Then over the last two weeks developed progressive productive cough and exertional dyspnea.   He was seen in urgent care and PCP and prescribed Azithromycin, nebs and steroids without relief.  Cefdenir was added and pt still had persistent shortness of breath.   He followed up with pulmonology, oxygen was 82% on RA at the time and was send home on 2L home O2 and given steroid injection.  All Covid-19 tests have been negative.   Followed with pulm on 5/27 and plan to start CPAP and increase home O2 to 4L.   However over the last week pt's wife has noted increasing cough and shortness of breath to the point that he cannot walk short distances, so called EMS. They report decreased appetite and chills, no measured fever.  In the ED, pt was initially hypoxic to 72% after running out of his home oxygen.   Oxygen improved with nasal cannula, work-up significant for WBC 15k.  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  Pt was admitted to the hospitalists and PCCM consulted.   Pt reports that he smoked 1-2 cigarettes per day, but quit over 146yrago.  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  No significant family history of lung disease and no known allergies, denies new pets or known mold exposure.    Pt speaks some English, seen with wife who felt comfortable translating as  needed.    Pertinent  Medical History  HTN  Significant Hospital Events: Including procedures, antibiotic start and stop dates in addition to other pertinent events   6/5 Admit to hospitalists 6/6 increasing O2 requirement, PCCM consult 6/8 Requiring 15L NRB + [place] O2  6/9 On heated high flow / NRB alternating with BiPAP, 1.9L negative with 20 mg lasix  6/10 On 45L / 100% HHFNC + NRB.  Precedex started for anxiety while on BiPAP    Micro: 6/6 Extended Respiratory viral panel >> negative 6/5 Covid-19 >> negative 6/5 Sputum culture >> 6/7 Strep Pneumo >> negative 6/7 Legionella Ur Ag >> negative  Interim History / Subjective:   [facility] Provided this information to the patient and his family. He has been transitioned to comfort care measures. There is planning for a wedding tomorrow so he and his significant other of 35+ years can be officially married.   Objective   Blood pressure 134/70, pulse 62, temperature 98.1 F (36.7 C), temperature source Axillary, resp. rate (!) 22, SpO2 (!) 80 %.    FiO2 (%):  [100 %] 100 %   Intake/Output Summary (Last 24 hours) at 08/11/2020 0728 Last data filed at 08/11/2020 0700 Gross per 24 hour  Intake 1568.63 ml  Output 575 ml  Net 993.63 ml   There were no vitals filed for this visit.  General: ill appearing adult  male lying in bed, bipap in place, no distress HEENT: [place]/AT, bipap in place,  Neuro: AAOx4, moving all extremities CV: s1s2, RRR, no m/r/g PULM: tachypnea, course breath sounds with dry inspiratory crackles GI: soft, bsx4 active  Extremities: warm/dry, no edema  Skin: no rashes or lesions   Labs/imaging that I havepersonally reviewed  (right click and "Reselect all SmartList Selections" daily)  CBC - WBC 15.9, hgb 13.3 BMP - Na 138, K 4.7, BUN 28 Sr Cr 0.53  PCT - 0.64 > 0.47 PCXR - diffuse interstitial airspace disease  ECHO - LVEF 60-65%, Grade I DD  CRP 15.3  >13.1 BNP 45.5 > 118 HP Panel >> Negative   6/11 Procalcitonin 0.2  LDH 408 BNP 118.2 CK 82  Resolved Hospital Problem list     Assessment & Plan:    Acute on Chronic Hypoxic Respiratory Failure 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 Treated with multiple courses of antibiotics and steroids without improvement over recent weeks. CRP elevated, sed rate mildly up. Viral panel, ANCA negative. Minimal eosinophils on differential on admit. Elevated RF 17.8 / ESR / CRP. COVID antibodies negative. Hypersensitivity panel is negative. Urine strep and legionella ag are negative. Extended viral panel is negative. Discussed case with [facility] team on 6/11 and given his significant oxygen needs, progression of lung disease on chest imaging and inability to ambulate to work with PT at this time he is not a candidate for inpatient lung transplant evaluation.  - Patient and family have agreed to comfort measures but with plans to keep bipap/HFNC in place in order to give him a chance to be married tomorrow.  - Will continue antibiotics and IV steroids as well   Anxiety/Pain -precedex infusion for comfort  -morphine drip   DM -SSI   HTN -hold losartan  -monitor BP trend   Obesity  BMI 30  Best practice (right click and "Reselect all SmartList Selections" daily)  Diet:  NPO Pain/Anxiety/Delirium protocol (if indicated): precedex ordered, morphine gtt VAP protocol (if indicated): n/a DVT prophylaxis: LMWH GI prophylaxis: PPI Glucose control:  SSI Yes Central venous access:  N/A Arterial line:  N/A Foley:  N/A Mobility:  bed rest  PT consulted: N/A Last date of multidisciplinary goals of care discussion [6/11 with patient's wife, 2 daughters and son. Since patient is not a lung transplant candidate he has transitioned to comfort measures] Code Status:   DNR/DNI Disposition: ICU   Critical care time:  n/a     Freda Jackson, MD [place] Office: [phone]   See Amion for personal pager PCCM on call pager [phone] until 7pm. Please call Elink 7p-7a. [phone]

## 2020-08-12 DIAGNOSIS — J189 Pneumonia, unspecified organism: Secondary | ICD-10-CM

## 2020-08-12 DIAGNOSIS — G4733 Obstructive sleep apnea (adult) (pediatric): Secondary | ICD-10-CM

## 2020-08-12 DIAGNOSIS — R0902 Hypoxemia: Secondary | ICD-10-CM

## 2020-08-12 LAB — GLUCOSE, CAPILLARY
Glucose-Capillary: 213 mg/dL — ABNORMAL HIGH (ref 70–99)
Glucose-Capillary: 217 mg/dL — ABNORMAL HIGH (ref 70–99)

## 2020-08-12 NOTE — Telephone Encounter (Signed)
Currently seeing the patient in the hospital  Addressed

## 2020-08-12 NOTE — Progress Notes (Signed)
I received a referral from [facility] who saw the patient over the weekend.  The patient and family wished to have a wedding ceremony between him and his SO, who is the mother of his children.  She has gone to the register of deeds and received paperwork for the wedding license.  Her plan is to bring that back here and have it notarized, so long as he is able to give consent.  She will then need to return to the register of deeds to get the marriage license finalized.  At that point, I will plan to officiate the wedding once we have obtained the marriage license.  Chaplain Dyanne Carrel, Bcc Pager, [phone] 12:58 PM

## 2020-08-12 NOTE — Progress Notes (Signed)
NAME:  Thomas Glass, MRN:  [id number], DOB:  28-May-1955, LOS: 7 ADMISSION DATE:  07/31/2020, CONSULTATION DATE:  08/12/20 REFERRING MD:  Denton Lank, CHIEF COMPLAINT:  Shortness of breath    History of Present Illness:  65 y.o. M with PMH of HTN and remote tobacco use along with hospitalization for PNA >87yrs ago who was in his usual state of health until approximately 6 months ago when he began to develop shortness of breath, seen at pulmonary office with concern for OSA, did not start CPAP.   Then over the last two weeks developed progressive productive cough and exertional dyspnea.   He was seen in urgent care and PCP and prescribed Azithromycin, nebs and steroids without relief.  Cefdenir was added and pt still had persistent shortness of breath.   He followed up with pulmonology, oxygen was 82% on RA at the time and was send home on 2L home O2 and given steroid injection.  All Covid-19 tests have been negative.   Followed with pulm on 5/27 and plan to start CPAP and increase home O2 to 4L.   However over the last week pt's wife has noted increasing cough and shortness of breath to the point that he cannot walk short distances, so called EMS. They report decreased appetite and chills, no measured fever.  In the ED, pt was initially hypoxic to 72% after running out of his home oxygen.   Oxygen improved with nasal cannula, work-up significant for WBC 15k.  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  Pt was admitted to the hospitalists and PCCM consulted.   Pt reports that he smoked 1-2 cigarettes per day, but quit over 2yrs ago.  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  No significant family history of lung disease and no known allergies, denies new pets or known mold exposure.    Pt speaks some English, seen with wife who felt comfortable translating as  needed.    Pertinent  Medical History  HTN  Significant Hospital Events: Including procedures, antibiotic start and stop dates in addition to other pertinent events   6/5 Admit to hospitalists 6/6 increasing O2 requirement, PCCM consult 6/8 Requiring 15L NRB + [place] O2  6/9 On heated high flow / NRB alternating with BiPAP, 1.9L negative with 20 mg lasix  6/10 On 45L / 100% HHFNC + NRB.  Precedex started for anxiety while on BiPAP 6/13 Now comfort measures, married today    Micro: 6/6 Extended Respiratory viral panel >> negative 6/5 Covid-19 >> negative 6/5 Sputum culture >>normal flora 6/7 Strep Pneumo >> negative 6/7 Legionella Ur Ag >> negative  Interim History / Subjective:   Patient was turned down by [facility] program 6/11. Provided this information to the patient and his family. He has been transitioned to comfort care measures, plan to officially marry his significant other of 35+ years.  Comfortable overnight on precedex and Bipap   Objective   Blood pressure (!) 146/85, pulse 70, temperature 98 F (36.7 C), temperature source Axillary, resp. rate 15, SpO2 90 %.    FiO2 (%):  [100 %] 100 %   Intake/Output Summary (Last 24 hours) at 08/12/2020 0714 Last data filed at 08/12/2020 0400 Gross per 24 hour  Intake 913.28 ml  Output 900 ml  Net 13.28 ml    There were no vitals filed for this visit.  General: Ill-appearing male, comfortable on BiPAP in no distress HEENT: MM pink/moist Neuro: Sleeping, arousable CV: Regular rate and rhythm PULM: No tachypnea or retractions on BiPAP, no respiratory distress Extremities: warm/dry, no edema  Skin: no rashes or lesions    Labs/imaging that I havepersonally reviewed  (right click and "Reselect all SmartList Selections" daily)  CBC - WBC 15.9, hgb 13.3 BMP - Na 138, K 4.7, BUN 28 Sr Cr 0.53  PCT - 0.64 > 0.47 PCXR - diffuse interstitial airspace disease  ECHO - LVEF 60-65%, Grade I DD  CRP 15.3 >13.1 BNP  45.5 > 118 HP Panel >> Negative   6/11 Procalcitonin 0.2  LDH 408 BNP 118.2 CK 82   6/13 Glucose 213  Resolved Hospital Problem list     Assessment & Plan:    Acute on Chronic Hypoxic Respiratory Failure 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 Treated with multiple courses of antibiotics and steroids without improvement over recent weeks. CRP elevated, sed rate mildly up. Viral panel, ANCA negative. Minimal eosinophils on differential on admit. Elevated RF 17.8 / ESR / CRP. COVID antibodies negative. Hypersensitivity panel is negative. Urine strep and legionella ag are negative. Extended viral panel is negative. Discussed case with [facility] team on 6/11 and given his significant oxygen needs, progression of lung disease on chest imaging and inability to ambulate to work with PT at this time he is not a candidate for inpatient lung transplant evaluation. P: -Patient was not a candidate for lung transplant, transition to comfort care but continue BiPAP and high flow nasal cannula in order to be married today -Comfortable on Precedex, continue antibiotics and steroids   - Patient and family have agreed to comfort measures but with plans to keep bipap/HFNC in place in order to give him a chance to be married tomorrow.  - Will continue cefepime and azithromycin and IV steroids as well   Anxiety/Pain -Precedex and morphine as needed  DM -SSI   HTN -hold losartan  -monitor BP trend     Best practice (right click and "Reselect all SmartList Selections" daily)  Diet:  NPO Pain/Anxiety/Delirium protocol (if indicated): precedex ordered, morphine gtt VAP protocol (if indicated): n/a DVT prophylaxis: LMWH GI prophylaxis: PPI Glucose control:  SSI Yes Central venous access:  N/A Arterial line:  N/A Foley:  N/A Mobility:  bed rest  PT consulted: N/A Last  date of multidisciplinary goals of care discussion [6/11 with patient's wife, 2 daughters and son. Since patient is not a lung transplant candidate he has transitioned to comfort measures] Code Status:  DNR/DNI Disposition: ICU   Critical care time:  n/a   Otilio Carpen Tarisha Fader, PA-C [place] See Amion for pager If no response to pager , please call [phone] until 7pm After 7:00 pm call Elink  [phone]

## 2020-08-12 NOTE — TOC Progression Note (Signed)
Transition of Care [facility]) - Progression Note    Patient Details  Name: Thomas Glass MRN: [id number] Date of Birth: [birth year]  Transition of Care [facility] CM/SW Contact  Golda Acre, RN Phone Number: 08/12/2020, 7:45 AM  Clinical Narrative:    Significant Hospital Events: Including procedures, antibiotic start and stop dates in addition to other pertinent events  6/5 Admit to hospitalists 6/6 increasing O2 requirement, PCCM consult 6/8 Requiring 15L NRB + [place] O2 6/9 On heated high flow / NRB alternating with BiPAP, 1.9L negative with 20 mg lasix 6/10 On 45L / 100% HHFNC + NRB.  Precedex started for anxiety while on BiPAP Bipap at 100%, iv abxx2, iv precedex, iv solu medrol/ PLan: undetermined at this time due to condition.     Barriers to Discharge: Continued Medical Work up  Expected Discharge Plan and Services                                                 Social Determinants of Health (SDOH) Interventions    Readmission Risk Interventions No flowsheet data found.

## 2020-08-13 NOTE — Progress Notes (Signed)
Palliative Care consult was received.  I reviewed chart and stopped by to meet with Mr. Thomas Glass.  I met chaplain, Joellen Jersey, outside of his room.  She had just finished officiating his wedding, which has been a major goal of his.  Family was enjoying time and fellowship and I did not disturb them this evening.    Will plan to follow-up tomorrow.  Micheline Rough, MD [place] Team [phone]

## 2020-08-13 NOTE — Progress Notes (Signed)
NAME:  Thomas Glass, MRN:  [id number], DOB:  10-09-1955, LOS: 8 ADMISSION DATE:  08/06/2020, CONSULTATION DATE:  08/13/20 REFERRING MD:  Thomas Glass, CHIEF COMPLAINT:  Shortness of breath    History of Present Illness:  65 y.o. M with PMH of HTN and remote tobacco use along with hospitalization for PNA >76yr ago who was in his usual state of health until approximately 6 months ago when he began to develop shortness of breath, seen at pulmonary office with concern for OSA, did not start CPAP.   Then over the last two weeks developed progressive productive cough and exertional dyspnea.   He was seen in urgent care and PCP and prescribed Azithromycin, nebs and steroids without relief.  Cefdenir was added and pt still had persistent shortness of breath.   He followed up with pulmonology, oxygen was 82% on RA at the time and was send home on 2L home O2 and given steroid injection.  All Covid-19 tests have been negative.   Followed with pulm on 5/27 and plan to start CPAP and increase home O2 to 4L.   However over the last week pt's wife has noted increasing cough and shortness of breath to the point that he cannot walk short distances, so called EMS. They report decreased appetite and chills, no measured fever.  In the ED, pt was initially hypoxic to 72% after running out of his home oxygen.   Oxygen improved with nasal cannula, work-up significant for WBC 15k.  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  Pt was admitted to the hospitalists and PCCM consulted.   Pt reports that he smoked 1-2 cigarettes per day, but quit over 132yrago.  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  No significant family history of lung disease and no known allergies, denies new pets or known mold exposure.    Pt speaks some English, seen with wife who felt comfortable translating as  needed.    Pertinent  Medical History  HTN  Significant Hospital Events: Including procedures, antibiotic start and stop dates in addition to other pertinent events   6/5 Admit to hospitalists 6/6 increasing O2 requirement, PCCM consult 6/8 Requiring 15L NRB + [place] O2  6/9 On heated high flow / NRB alternating with BiPAP, 1.9L negative with 20 mg lasix  6/10 On 45L / 100% HHFNC + NRB.  Precedex started for anxiety while on BiPAP 6/13 Now comfort measures, married today    Micro: 6/6 Extended Respiratory viral panel >> negative 6/5 Covid-19 >> negative 6/5 Sputum culture >>normal flora 6/7 Strep Pneumo >> negative 6/7 Legionella Ur Ag >> negative  Interim History / Subjective:   Now comfort care, awaiting marriage license, chaplain able to perform ceremony once patient's significant other has this notarized.  Comfortable on morphine, Precedex and BiPAP  Objective   Blood pressure 124/65, pulse 69, temperature 98.5 F (36.9 C), temperature source Axillary, resp. rate 16, SpO2 92 %.        Intake/Output Summary (Last 24 hours) at 08/13/2020 0841 Last data filed at 08/13/2020 0552 Gross per 24 hour  Intake 1068.72 ml  Output 1000 ml  Net 68.72 ml    There were no vitals filed for this visit.  General: Resting on BiPAP, comfortable, family at the bedside HEENT: MM pink/moist Neuro: Somnolent though arousable, no focal deficits CV: s1s2 RRR PULM: Comfortable on BiPAP  16/12, 50 percent Extremities: warm/dry, no edema  Skin: no rashes or lesions     Labs/imaging that I havepersonally reviewed  (right click and "Reselect all SmartList Selections" daily)  Glucose 217  Resolved Hospital Problem list     Assessment & Plan:    Acute on Chronic Hypoxic Respiratory Failure 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 Treated with multiple courses of antibiotics and  steroids without improvement over recent weeks. CRP elevated, sed rate mildly up. Viral panel, ANCA negative. Minimal eosinophils on differential on admit. Elevated RF 17.8 / ESR / CRP. COVID antibodies negative. Hypersensitivity panel is negative. Urine strep and legionella ag are negative. Extended viral panel is negative. Discussed case with [facility] team on 6/11 and given his significant oxygen needs, progression of lung disease on chest imaging and inability to ambulate to work with PT at this time he is not a candidate for inpatient lung transplant evaluation. P: -Transition to comfort care, not a candidate for lung transplant, no clear infectious source.  Patient comfortable on BiPAP with family at the bedside, awaiting notarized marriage license for chaplain to perform wedding with his significant other of over 35 years -Comfortable on Precedex, continue antibiotics and steroids    Anxiety/Pain -Precedex and morphine as needed  DM -SSI   HTN -hold losartan  -monitor BP trend     Best practice (right click and "Reselect all SmartList Selections" daily)  Diet: As tolerated Pain/Anxiety/Delirium protocol (if indicated): precedex ordered, morphine gtt VAP protocol (if indicated): n/a DVT prophylaxis: LMWH GI prophylaxis: N/A Glucose control:  SSI Yes Central venous access:  N/A Arterial line:  N/A Foley:  N/A Mobility:  bed rest  PT consulted: N/A Last date of multidisciplinary goals of care discussion [6/11 with patient's wife, 2 daughters and son. Since patient is not a lung transplant candidate he has transitioned to comfort measures] Code Status:  DNR/DNI Disposition: ICU   Critical care time:  n/a        Thomas Carpen [name], PA-C [place] [facility] See Amion for pager If no response to pager , please call [phone] until 7pm After 7:00 pm call Elink  [phone]?[place]

## 2020-08-13 NOTE — Progress Notes (Signed)
I officiated at the wedding of Thomas and Thomas Glass with their family present.  I filled out the legal paperwork and will put it in the mail tomorrow to the register of deeds.  I provided emotional support and presence as well.  Chaplain Dyanne Carrel, Bcc Pager, [phone] 6:14 PM

## 2020-08-13 NOTE — Progress Notes (Signed)
Provided spiritual support to patient's SO and made plans to facilitate wedding when paperwork is completed and she has obtained the marriage license.  Provided prayer at bedside for patient at family's request.

## 2020-08-13 NOTE — Telephone Encounter (Signed)
Dr. Val Eagle, please advise on refill request. Pt requesting 90-day supply.

## 2020-08-14 DIAGNOSIS — Z7189 Other specified counseling: Secondary | ICD-10-CM

## 2020-08-14 DIAGNOSIS — Z66 Do not resuscitate: Secondary | ICD-10-CM | POA: Diagnosis not present

## 2020-08-14 DIAGNOSIS — J84112 Idiopathic pulmonary fibrosis: Principal | ICD-10-CM | POA: Diagnosis present

## 2020-08-14 DIAGNOSIS — J80 Acute respiratory distress syndrome: Secondary | ICD-10-CM

## 2020-08-14 DIAGNOSIS — Z515 Encounter for palliative care: Secondary | ICD-10-CM

## 2020-08-14 MED ORDER — LORAZEPAM 2 MG/ML IJ SOLN
2.0000 mg | INTRAMUSCULAR | Status: DC | PRN
  Administered 2020-08-15: 2 mg via INTRAVENOUS
  Filled 2020-08-14: qty 1

## 2020-08-14 NOTE — Progress Notes (Signed)
NAME:  Thomas Glass, MRN:  [id number], DOB:  May 31, 1955, LOS: 8 ADMISSION DATE:  08-06-20, CONSULTATION DATE:  08/13/20 REFERRING MD:  Thomas Glass, CHIEF COMPLAINT:  Shortness of breath    Brief  65 y.o. M with PMH of HTN and remote tobacco use along with hospitalization for PNA >78yrs ago who was in his usual state of health until approximately 6 months ago when he began to develop shortness of breath, seen at pulmonary office with concern for OSA, did not start CPAP.   Then over the last two weeks developed progressive productive cough and exertional dyspnea.   He was seen in urgent care and PCP and prescribed Azithromycin, nebs and steroids without relief.  Cefdenir was added and pt still had persistent shortness of breath.   He followed up with pulmonology, oxygen was 82% on RA at the time and was send home on 2L home O2 and given steroid injection.  All Covid-19 tests have been negative.   Followed with pulm on 5/27 and plan to start CPAP and increase home O2 to 4L.   However over the last week pt's wife has noted increasing cough and shortness of breath to the point that he cannot walk short distances, so called EMS. They report decreased appetite and chills, no measured fever.  In the ED, pt was initially hypoxic to 72% after running out of his home oxygen.   Oxygen improved with nasal cannula, work-up significant for WBC 15k.  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  Pt was admitted to the hospitalists and PCCM consulted.   Pt reports that he smoked 1-2 cigarettes per day, but quit over 70yrs ago.  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  No significant family history of lung disease and no known allergies, denies new pets or known mold exposure.    Pt speaks some English, seen with wife who felt comfortable translating as needed.   Baseline pre-admit  vcxr 07/19/20 - already shows evidence of chronic pulmonary fibrosis   Pertinent  Medical History  HTN  Significant Hospital Events: Including procedures, antibiotic start and stop dates in addition to other pertinent events   May 2022: Baseline chest x-ray with ILD changes/pulmonary fibrosis very suggestive of UIP 6/5 Admit to hospitalists.  CT angiogram chest without pulmonary embolism but severe groundglass opacity consistent with ILD flareup 6/6 increasing O2 requirement, PCCM consult 6/8 Requiring 15L NRB + [place] O2  6/9 On heated high flow / NRB alternating with BiPAP, 1.9L negative with 20 mg lasix  6/10 On 45L / 100% HHFNC + NRB.  Precedex started for anxiety while on BiPAP 6/13 Now comfort measures, married today Now comfort care, awaiting marriage license, chaplain able to perform ceremony once patient's significant other has this notarized.  Comfortable on morphine, Precedex and BiPAP    Micro: 6/6 Extended Respiratory viral panel >> negative 6/5 Covid-19 >> negative 6/5 Sputum culture >>normal flora 6/7 Strep Pneumo >> negative 6/7 Legionella Ur Ag >> negative  Interim History / Subjective:   08/14/2020 -comfortable on BiPAP, morphine and Precedex.  Review of the records indicate that he had pulmonary fibrosis even at baseline.  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  Currently with terminal flareup and severe acute hypoxemic respiratory failure.  Objective  Vitals:   08/13/20 2116 08/14/20 0000 08/14/20 0400 08/14/20 0721  BP: 104/65   115/65  Pulse: (!) 51 (!) 43 (!) 45 (!) 116  Resp: 12 11 10  (!) 33  Temp:    98.8 F (37.1 C)  TempSrc:    Axillary  SpO2: 92% 95% 95% 96%   Terminally ill looking male.  On BiPAP on morphine and Precedex.  Comfortable RASS sedation score -4 equivalent.  Abdomen soft.   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] based on age greater than 70, male gender, rapid progression, current features of flareup, essentially negative serology, previous smoking  -present on admission  #Current - Acute on Chronic Hypoxic Respiratory Failure   -severe with ARDS physiology 100% Fio2 - presnt on admit -   Consistent with terminal IPF flareup -present on admission  #Anxiety/Pain/class IV dyspnea -Precedex  gtt -> DC - gtt morphine contiinue - if needed add versed gtt - haldol prn -BiPAP for respiratory distress comfort in severe ARDS and not for Rx of resp failure -> try ti wean once sedation gtt is optimized -DO NOT INTUBATE -No CPR  DM -SSI   HTN -hold losartan  -monitor BP trend   STATUS -Comfort care -Terminal care - Palliative care status -Prognosis: Few to several days     Best practice (right click and "Reselect all SmartList Selections" daily)  Diet: Comfort foods but appears he is currently NPO. Pain/Anxiety/Delirium protocol (if indicated): precedex ordered, morphine gtt VAP protocol (if indicated): n/a DVT prophylaxis: not indicated GI prophylaxis: N/A Glucose control:  SSI Yes Central venous access:  N/A Arterial line:  N/A Foley:  N/A Mobility:  bed rest  PT consulted: N/A Last date of multidisciplinary goals of care discussion [6/11 with patient's wife, 2 daughters and son. Since patient is not a lung transplant candidate he has transitioned to comfort measures] Code Status:  DNR/DNI Disposition: ICU -> try to dc precedex and possibly also bipap and move to palliative care floor   ATTESTATION & SIGNATURE   The patient Thomas Glass is critically ill with multiple organ systems failure and requires high complexity decision making for  assessment and support, frequent evaluation and titration of therapies, application of advanced monitoring technologies and extensive interpretation of multiple databases.   Critical Care Time devoted to patient care services described in this note is  40  Minutes. This time reflects time of care of this signee Dr Sima Matas. This critical care time does not reflect procedure time, or teaching time or supervisory time of PA/NP/Med student/Med Resident etc but could involve care discussion time     Dr. Kalman Shan, M.D., [facility].C.P Pulmonary and Critical Care Medicine Staff Physician [facility] Pager: [phone], If no answer or between  15:00h - 7:00h: call [phone]  08/14/2020 9:56 AM

## 2020-08-14 NOTE — Progress Notes (Signed)
PCCM Progress Note:  I was called to the bedside by the eICU and nursing staff. I spoke with patient's daughter and sister via interpreter services with Engineer, structural.   We spoke at length regarding his care and all the details of what comfort care entails. They both expressed understanding and the sister expressed she sees that her brother is dying. They will be speaking with the rest of the family on whether to remove the bipap support which is only prolonging his life at this time.   I updated the patient's nurse and they are aware of the plan that the family will notify us when they would like to remove the bipap mask. I ensured the family that the patient would be comfortable at the time we remove the mask.  Melody Comas, MD [facility] Office: [phone]   See Amion for personal pager PCCM on call pager [phone] until 7pm. Please call Elink 7p-7a. [phone]

## 2020-08-14 NOTE — Consult Note (Signed)
Consultation Note Date: 08/14/2020   Patient Name: Thomas Glass  DOB: 16-May-1955  MRN: [id number]  Age / Sex: 65 y.o., male  PCP: Trey Sailors, PA Referring Physician: Brand Males, MD  Reason for Consultation: Non pain symptom management, Pain control, Psychosocial/spiritual support, and Terminal Care  HPI/Patient Profile: 65 y.o. male  with past medical history of HTN and remote tobacco use admitted on 08/20/2020 with progressive respiratory failure related to interstitial lung disease who is dependent on Bipap around the clock.  Goal moving forward is comfort and palliative consulted for family support.   Clinical Assessment and Goals of Care: I saw and examined Thomas Glass today.  He is unresponsive on Bipap support.  I met today with patient's wife, brother in law, and sister.  I introduced palliative care as specialized medical care for people living with serious illness. It focuses on providing relief from the symptoms and stress of a serious illness. The goal is to improve quality of life for both the patient and the family.  We discussed clinical course as well as wishes moving forward in regard to Thomas Glass care plan.  We discussed difference between a aggressive medical intervention path and a palliative, comfort focused care path.    We had a long discussion regarding current care plan.  We specifically discussed reason why further fluids are likely to cause distress rather than comfort.  We discussed family questions regarding physical changes that family is seeing including tachycardia and peripheral cooling.  We also had a long discussion about his bipap dependence and questions that family had regarding concern if continued bipap support is really adding to his comfort or prolonging suffering.  We discussed nonverbal signs of distress and plan for medications to ensure he is not  suffering.  His sister expressed concern if continuing Bipap support is what he would want.  We again discussed consideration if continued bipap support is continuing to add quality to his life or if it is prolonging the process of his dying. We discussed plan to reassess tomorrow if we are seeing signs of distress or when we reach a point where he is not having meaningful awake time that this may be a point to consider discontinuing Bipap.   Questions and concerns addressed.   PMT will continue to support holistically.   SUMMARY OF RECOMMENDATIONS   - Overall goal is comfort moving forward.   - He appears comfortable on my exam today. - Family is discussing what they believe would be his desire regarding continuation of Bipap. - Plan to meet again tomorrow to reassess.   Code Status/Advance Care Planning: DNR   Prognosis:  Hours - Days  Discharge Planning: Anticipated Hospital Death      Primary Diagnoses: Present on Admission:  Acute on chronic respiratory failure with hypoxia (HCC)  OSA (obstructive sleep apnea)  IPF (idiopathic pulmonary fibrosis) (HCC)  ARDS (adult respiratory distress syndrome) ([place])   I have reviewed the medical record, interviewed the patient and family, and examined the patient. The  following aspects are pertinent.  Past Medical History:  Diagnosis Date   Hypertension    Social History   Socioeconomic History   Marital status: Soil scientist    Spouse name: Not on file   Number of children: Not on file   Years of education: Not on file   Highest education level: Not on file  Occupational History   Not on file  Tobacco Use   Smoking status: Former    Packs/day: 0.25    Years: 20.00    Pack years: 5.00    Types: Cigarettes    Quit date: 03/03/1999    Years since quitting: 21.4   Smokeless tobacco: Never  Vaping Use   Vaping Use: Never used  Substance and Sexual Activity   Alcohol use: Yes   Drug use: Not Currently   Sexual activity:  Not on file  Other Topics Concern   Not on file  Social History Narrative   Not on file   Social Determinants of Health   Financial Resource Strain: Not on file  Food Insecurity: Not on file  Transportation Needs: Not on file  Physical Activity: Not on file  Stress: Not on file  Social Connections: Not on file   History reviewed. No pertinent family history. Scheduled Meds:  Chlorhexidine Gluconate Cloth  6 each Topical Daily   mouth rinse  15 mL Mouth Rinse BID   Continuous Infusions:  sodium chloride Stopped (08/10/20 1710)   morphine 15 mg/hr (08/14/20 1842)   PRN Meds:.acetaminophen **OR** acetaminophen, albuterol, diphenhydrAMINE, glycopyrrolate **OR** glycopyrrolate **OR** glycopyrrolate, haloperidol lactate, LORazepam, morphine injection, morphine, polyvinyl alcohol Medications Prior to Admission:  Prior to Admission medications   Medication Sig Start Date End Date Taking? Authorizing Provider  albuterol (VENTOLIN HFA) 108 (90 Base) MCG/ACT inhaler Inhale 2 puffs into the lungs every 6 (six) hours as needed for wheezing or shortness of breath.   Yes [provider]  Ascorbic Acid (VITAMIN C PO) Take 1 tablet by mouth daily.   Yes [provider]  cefdinir (OMNICEF) 300 MG capsule Take 300 mg by mouth 2 (two) times daily.   Yes [provider]  Cyanocobalamin (VITAMIN B-12 PO) Take 1 tablet by mouth daily.   Yes [provider]  ibuprofen (ADVIL) 200 MG tablet Take 400 mg by mouth every 6 (six) hours as needed for fever.   Yes [provider]  losartan (COZAAR) 25 MG tablet Take 25 mg by mouth daily.   Yes [provider]  MAGNESIUM PO Take 1 tablet by mouth daily.   Yes [provider]  OXYGEN Inhale 2 L into the lungs continuous.   Yes [provider]  predniSONE (DELTASONE) 10 MG tablet Take 4 tabs po daily x 3 days; then 3 tabs daily x3 days; then 2 tabs daily x3 days; then 1 tab daily x 3 days; then  stop Patient taking differently: Take 10-40 mg by mouth See admin instructions. Take 4 tabs (40 mg) by mouth po daily x 3 days; then 3 tabs (30 mg) daily x3 days; then 2 tabs  (20 mg) daily x3 days; then 1 tab  (10 mg) daily x 3 days; then stop 07/23/20  Yes Martyn Ehrich, NP  chlorproMAZINE (THORAZINE) 50 MG tablet TAKE 1 TABLET BY MOUTH 2 TIMES DAILY AS NEEDED. 08/13/20   Laurin Coder, MD  promethazine-dextromethorphan (PROMETHAZINE-DM) 6.25-15 MG/5ML syrup Take 5 mLs by mouth 3 (three) times daily as needed for cough. Patient not taking: Reported  on 08/08/2020 07/19/20   Scot Jun, FNP   No Known Allergies Review of Systems Unable to obtain  Physical Exam General: Unresponsive on Bipap Heart: Tachycardic. No murmur appreciated. Lungs: Poor air movement, Scattered rhonchi Abdomen: Soft, nontender, nondistended, positive bowel sounds.   Ext: Some edema Skin: some peripheral cooling  Vital Signs: BP (!) 174/96   Pulse (!) 139   Temp (!) 100.5 F (38.1 C) (Axillary)   Resp 10   SpO2 94%  Pain Scale: CPOT   Pain Score: 0-No pain   SpO2: SpO2: 94 % O2 Device:SpO2: 94 % O2 Flow Rate: .O2 Flow Rate (L/min): 60 L/min (15L NRB)  IO: Intake/output summary:  Intake/Output Summary (Last 24 hours) at 08/14/2020 2305 Last data filed at 08/14/2020 2000 Gross per 24 hour  Intake 302.52 ml  Output 1100 ml  Net -797.48 ml    LBM: Last BM Date: 08/08/20 Baseline Weight:   Most recent weight:       Palliative Assessment/Data:     Time In: 1500 Time Out: 1620 Time Total: 80 minutes Greater than 50%  of this time was spent counseling and coordinating care related to the above assessment and plan.  Signed by: Micheline Rough, MD   Please contact Palliative Medicine Team phone at [phone] for questions and concerns.  For individual provider: See Shea Evans

## 2020-08-30 ENCOUNTER — Ambulatory Visit: Payer: Medicare Other | Admitting: Pulmonary Disease

## 2020-08-30 NOTE — Progress Notes (Signed)
Pt lost pulse at 0620. Heart rhythm asystole. Daughter at bedside. Heart sounds auscultated for 1 minute by [facility] RN and verified by this RN, no heart beat auscultated. Elink notified. Chaplain called to bedside.

## 2020-08-30 NOTE — Progress Notes (Signed)
Notified by Thurmon Fair, RN that pt expired at 0620, made Dr. Arsenio Loader aware.

## 2020-08-30 NOTE — Progress Notes (Signed)
100 mL of morphine wasted in Stericycle by this RN and witnessed by Lyndee Hensen RN.

## 2020-08-30 NOTE — Progress Notes (Signed)
Called to bedside because of BIPAP alarms. PT was pulling Vts of 150-192ml with 0 leak in mask. This RT increased IPAP to 24 from 16 and RR to 18 from 10. Vts only improved to 220-263ml. Alarms have been adjusted and nurse made aware.

## 2020-08-30 NOTE — TOC Transition Note (Signed)
Transition of Care [facility]) - CM/SW Discharge Note   Patient Details  Name: Thomas Glass MRN: [id number] Date of Birth: February 21, 1956  [facility] CM/SW Contact:  Golda Acre, RN Phone Number: September 06, 2020, 11:26 AM   Clinical Narrative:    Ptient expired at 0620 this am.   Final next level of care: Expired Barriers to Discharge: Barriers Resolved   Patient Goals and CMS Choice        Discharge Placement                       Discharge Plan and Services                                     Social Determinants of Health (SDOH) Interventions     Readmission Risk Interventions No flowsheet data found.

## 2020-08-30 NOTE — Progress Notes (Signed)
Palliative care brief note  I checked in on family to offer my condolences after hearing of [name] this morning.  Family expressed appreciation for staff and care that he received.  Denied other needs or questions at this time.  Romie Minus, MD [facility] Palliative Medicine Team [phone]  NO CHARGE NOTE

## 2020-08-30 NOTE — Discharge Summary (Signed)
DISCHARGE SUMMARY    Date of admit: 2020/08/28  3:33 PM Date of discharge: 08/14/2020 11:10 AM Length of Stay: 10 days  PCP is Norm Salt, PA  CAUSE(S) OF DEATH  Idiopathic Pulmonary Fibrosis - flare up resulting in ARDS   PROBLEM LIST Principal Problem:   ARDS (adult respiratory distress syndrome) (HCC) Active Problems:   Acute respiratory failure with hypoxia (HCC)   Acute on chronic respiratory failure with hypoxia (HCC)   IPF (idiopathic pulmonary fibrosis) (HCC)   DNR (do not resuscitate)   OSA (obstructive sleep apnea)   HTN (hypertension)   Encounter for palliative care   Tobacco abuse   Hyperglycemia    SUMMARY Arvin Abello was 65 y.o. patient with    has a past medical history of Hypertension., OSA, undiagnosed pulmonary fibrosis based on CXR   has no past surgical history on file.   Admitted on 2020/08/28 with  Zacchaeus Halm is a 65 y.o. male with medical history significant for  HTN, OSA not yet on CPAP who presents with progressive worsening shortness of breath and cough.   Patient began to have symptoms around the middle of May with increasing shortness of breath with exertion and cough productive of white sputum.  He was first evaluated by his PCP and started on prednisone and azithromycin. Reportedly had negative COVID test.  However he did not have any improvement and presented to urgent care on 5/20.  He was noted to be hypoxic down to 80% on room air which improved following albuterol and IM Decadron.  Had negative COVID test again.  He was given a course of cefdinir.  Then on 5/24 he was seen by pulmonology and was started on 2 L of oxygen to use with exertion and at night.  Also given Mucinex, albuterol and IM steroids.  However he continued to have worsening symptoms and had a repeat follow-up with pulmonology on 5/27 where his oxygen was increased to 4 L. For the past several days he had to use O2 Continuously and could not walk several  feet without shortness of breath.  Had an episode of chills about 2 days ago but did not take temperature.  Also had 1 episode of chest pain yesterday for several minutes when laying down.  He has also not been able to lay flat and has to sleep in a reclined.  Denies any lower extremity edema. Only used albuterol inhaler once today.   No recent travel. 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 Work is strict about wearing mask and denies coworkers with similar symptoms.  He endorses occasional tobacco use for many years but usually only smokes during gathering.  He reports a history of pneumonia about 30 years ago where he required mechanical ventilation.  Denies any family history of any pulmonary diseases.   He was recently diagnosed with OSA several months ago and is working with pulm to get his CPAP.   Smicro     Micro: 6/6 Extended Respiratory viral panel >> negative 6/5 Covid-19 >> negative 6/5 Sputum culture >>normal flora 6/7 Strep Pneumo >> negative 6/7 Legionella Ur Ag >> negative    Significant Hospital Events: Including procedures, antibiotic start and stop dates in addition to other pertinent events   May 2022: Baseline chest x-ray with ILD changes/pulmonary fibrosis very suggestive of UIP 6/5 Admit to hospitalists.  CT angiogram chest without pulmonary embolism but severe groundglass opacity consistent with ILD flareup 6/6 increasing O2 requirement, PCCM consult  6/8 Requiring 15L NRB +  O2 6/9 On heated high flow / NRB alternating with BiPAP, 1.9L negative with 20 mg lasix 6/10 On 45L / 100% HHFNC + NRB.  Precedex started for anxiety while on BiPAP 6/13 Now comfort measures, married today Now comfort care, awaiting marriage license, chaplain able to perform ceremony once patient's significant other has this notarized.  Comfortable on morphine, Precedex and BiPAP 08/14/2020 -comfortable on BiPAP, morphine and Precedex.  Review of the records indicate  that he had pulmonary fibrosis even at baseline.  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  Currently with terminal flareup and severe acute hypoxemic respiratory failure. 08/14/20 PM Dr Francine Graven -  We spoke at length regarding his care and all the details of what comfort care entails. They both expressed understanding and the sister expressed she sees that her brother is dying. They will be speaking with the rest of the family on whether to remove the bipap support which is only prolonging his life at this time. I updated the patient's nurse and they are aware of the plan that the family will notify us when they would like to remove the bipap mask. I ensured the family that the patient would be comfortable at the time we remove the mask.  PAtient expired 08/13/2020   SIGNED Dr. Kalman Shan, M.D., F.C.C.P Pulmonary and Critical Care Medicine Staff Physician [facility] Pager: [phone], If no answer or between  15:00h - 7:00h: call [phone]  08/19/2020 2:07 PM

## 2020-08-30 NOTE — Progress Notes (Signed)
Day chaplain rec'd referral from Vernelle Emerald that patient died early this morning. Vernelle Emerald came in and provided support and prayer for the family. This chaplain came for followup support.  Family still present bedside. The handprints that they requested had been done.  The chaplain said she was available for any more of their needs today. Rev. Lynnell Chad Pager [phone]

## 2020-08-30 NOTE — Progress Notes (Signed)
Chaplain was paged to offer support for this patient that had died. Chaplain arrived and 1 daughter present.  Other family, sons, daughters and patient spouse arrived.  Family grieving the loss.  Chaplain provided ministry or non anxious presence, words of comfort and prayer.  Chaplain connected about funeral home and unsure what they will do. Chaplain gave spouse a patient placement card.  One of the daughters, lost her daughter a year ago and is having a really hard time.  Spouse explained they want to take the patient back to [place] and so they are discussing cremation.  Chaplain connected with RN and day [place] for follow up support.  Family requested hand prints and RN is working on that.  Chaplain available as needed. Chaplain Agustin Cree, [place].    08/03/2020 0810  Clinical Encounter Type  Visited With Patient and family together;Health care provider  Visit Type Death  Referral From Nurse  Consult/Referral To Chaplain  Spiritual Encounters  Spiritual Needs Prayer;Emotional;Grief support  Stress Factors  Family Stress Factors Loss

## 2020-08-30 DEATH — deceased

## 2023-03-20 IMAGING — DX DG CHEST 2V
2 series · 2 of 2 positions shown · non-contrast
Comparison: None.

CLINICAL DATA: [id number] positivity with shortness of breath,
initial encounter

EXAM:
CHEST - 2 VIEW

[chest pa]
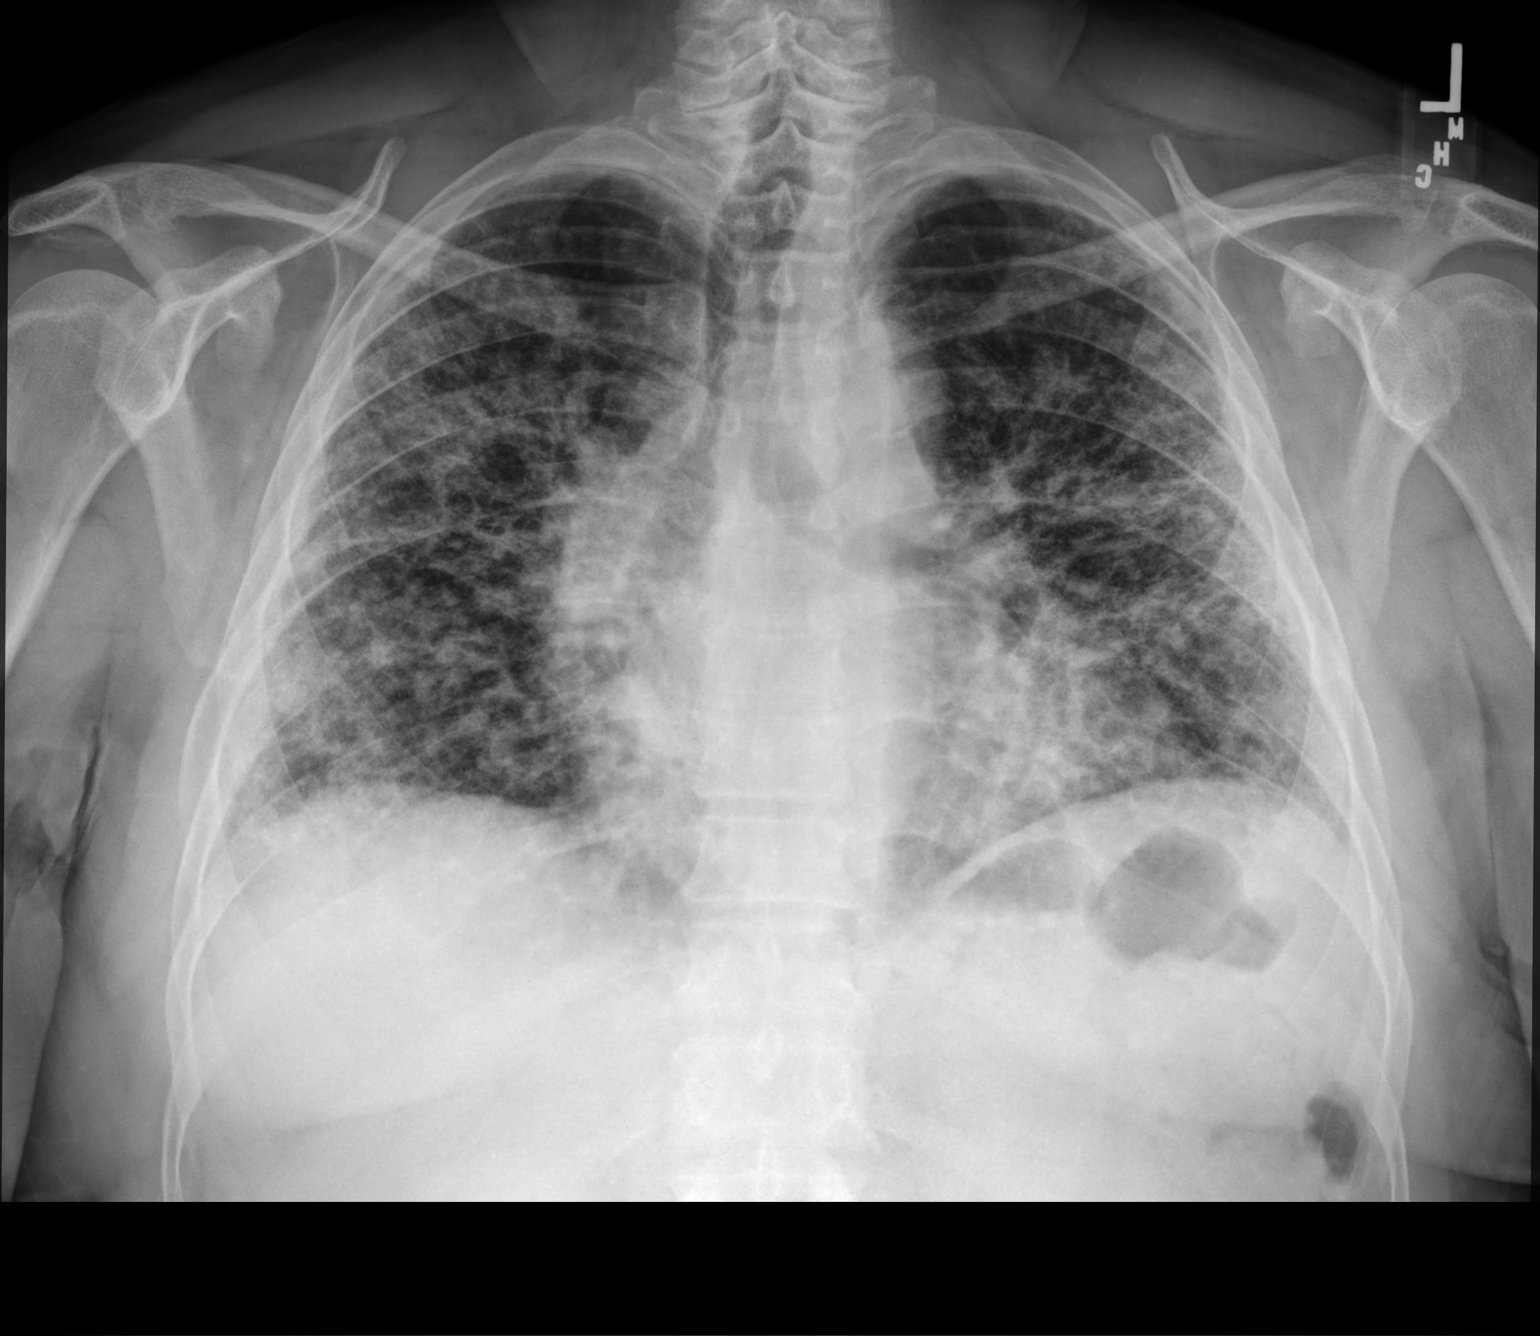

[chest lat]
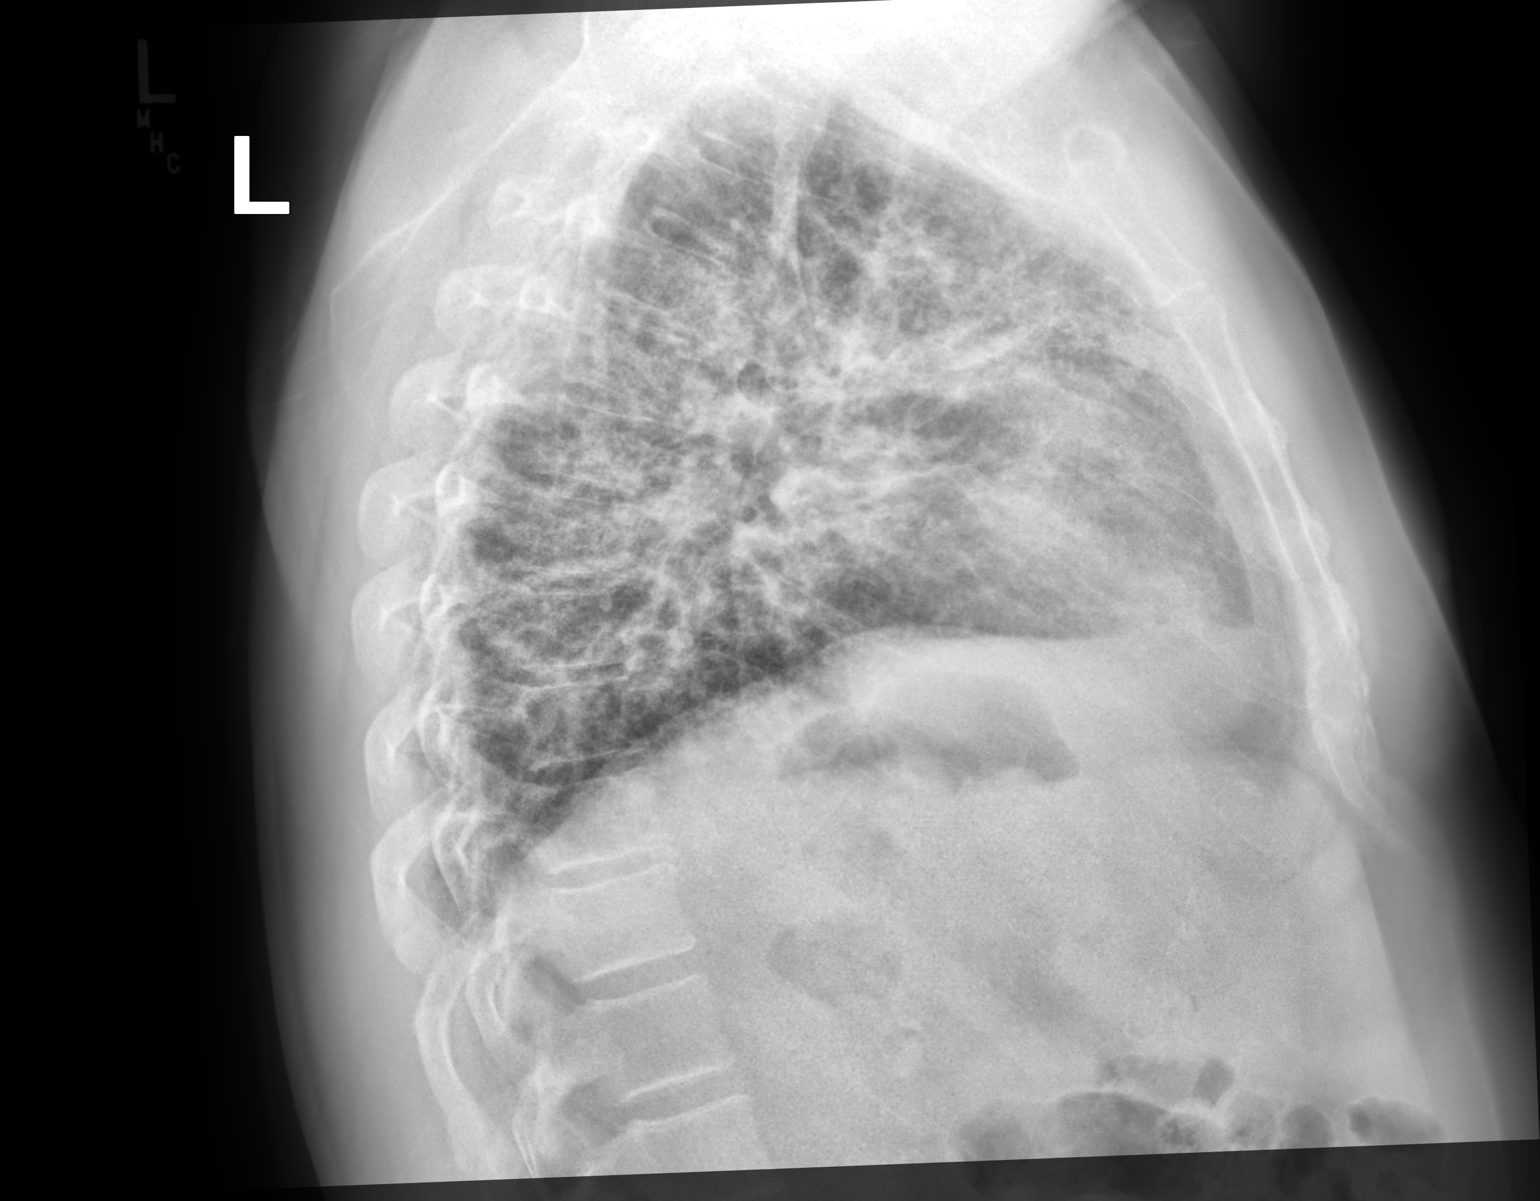

[2 of 2 positions shown; findings below may reference images not displayed]

FINDINGS: Cardiac shadow is within normal limits. The lungs are well aerated
bilaterally. Diffuse airspace opacities are noted bilaterally. Given
the patient's clinical history these likely represent sequelae from
[id number] pneumonia although no prior exams are available for
comparison. Upper abdomen and bony structures are within normal
limits.
IMPRESSION: Bilateral airspace opacities likely related to [id number] pneumonia.
Follow-up examination is recommended when patient's condition
improves.

## 2023-04-05 IMAGING — DX DG CHEST 1V PORT
1 series · 1 of 1 positions shown · non-contrast
Comparison: Portable exam 0037 hours compared to 07/19/2020

CLINICAL DATA: Recent pneumonia, was on home oxygen and ran out of
oxygen this afternoon, oxygen desaturation

EXAM:
PORTABLE CHEST 1 VIEW

[chest ap]
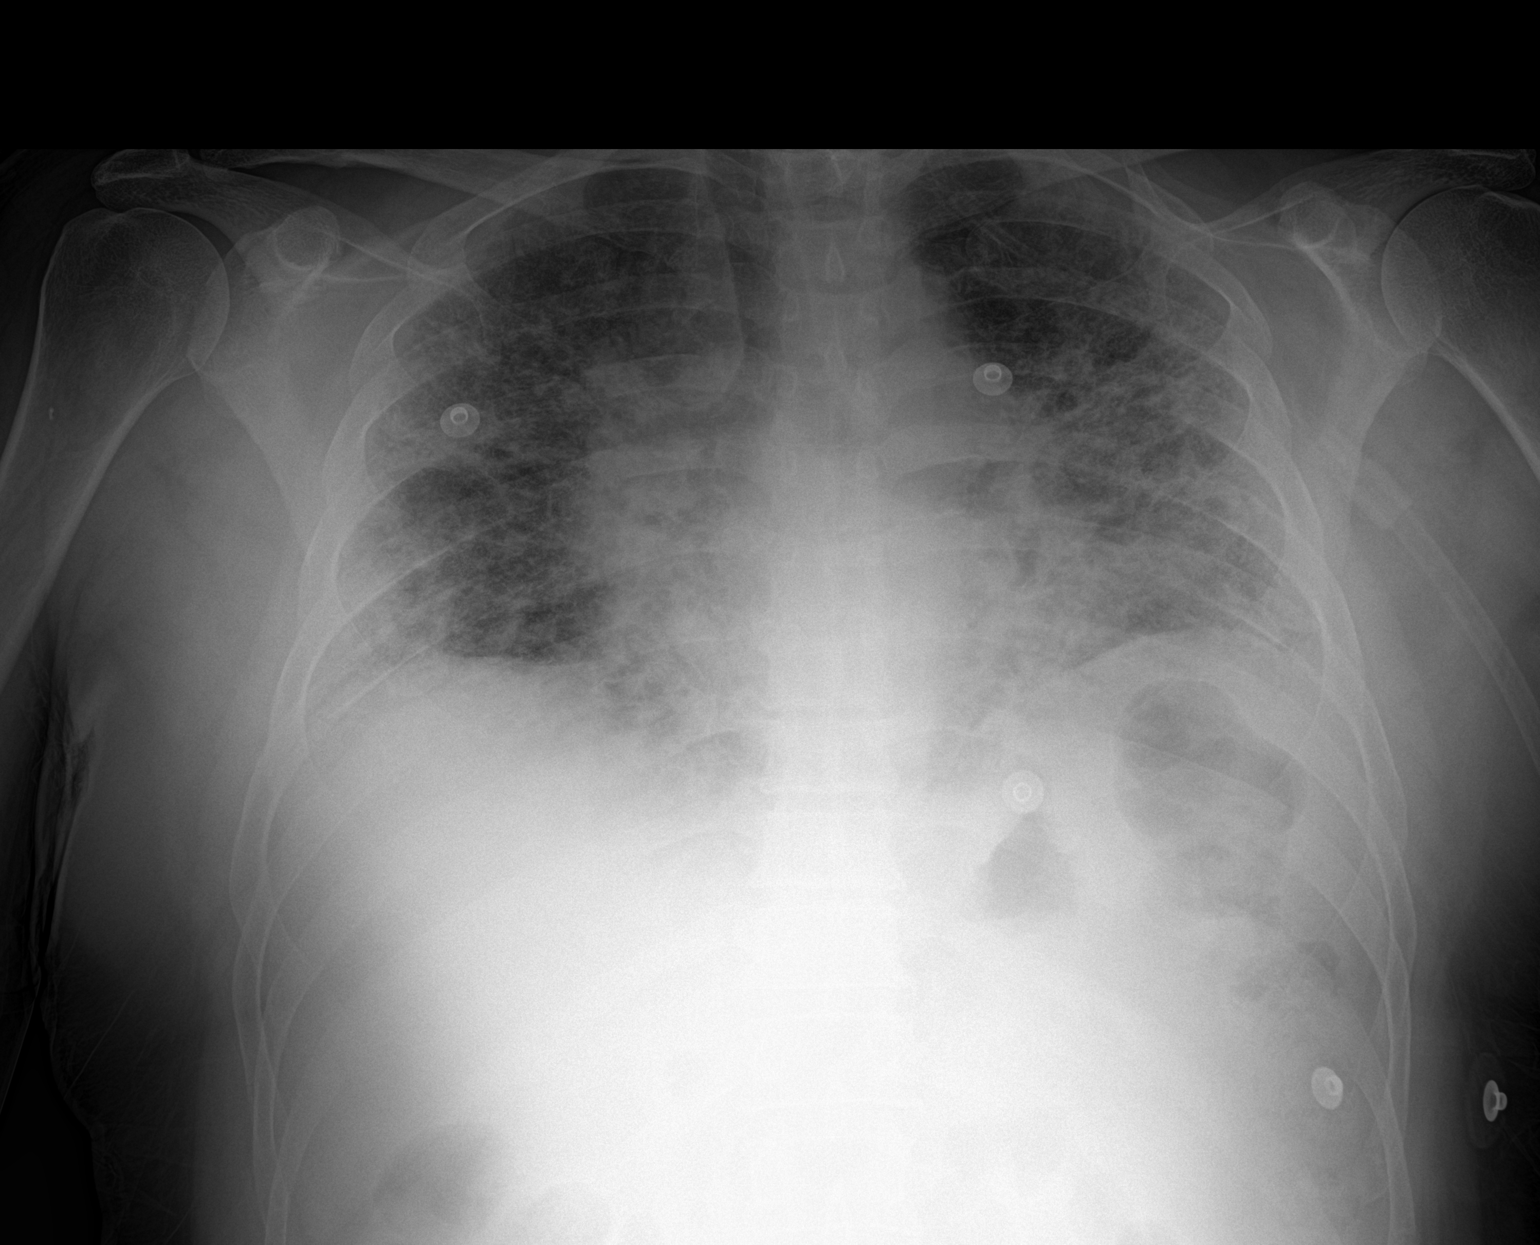

[1 of 1 positions shown; findings below may reference images not displayed]

FINDINGS: Upper normal size of cardiac silhouette.

Slightly rotated to the RIGHT.

Extensive BILATERAL pulmonary infiltrates, probably slightly
increased.

No pleural effusion or pneumothorax.

No acute osseous findings.
IMPRESSION: Diffuse BILATERAL pulmonary infiltrates consistent with multifocal
pneumonia, probably slightly increased.

## 2023-04-08 IMAGING — DX DG CHEST 1V PORT
1 series · 1 of 1 positions shown · non-contrast
Comparison: CT 08/04/2020.  Chest x-ray 08/04/2020.

CLINICAL DATA: COVID positive. Shortness of breath. Respiratory
failure.

EXAM:
PORTABLE CHEST 1 VIEW

[chest ap]
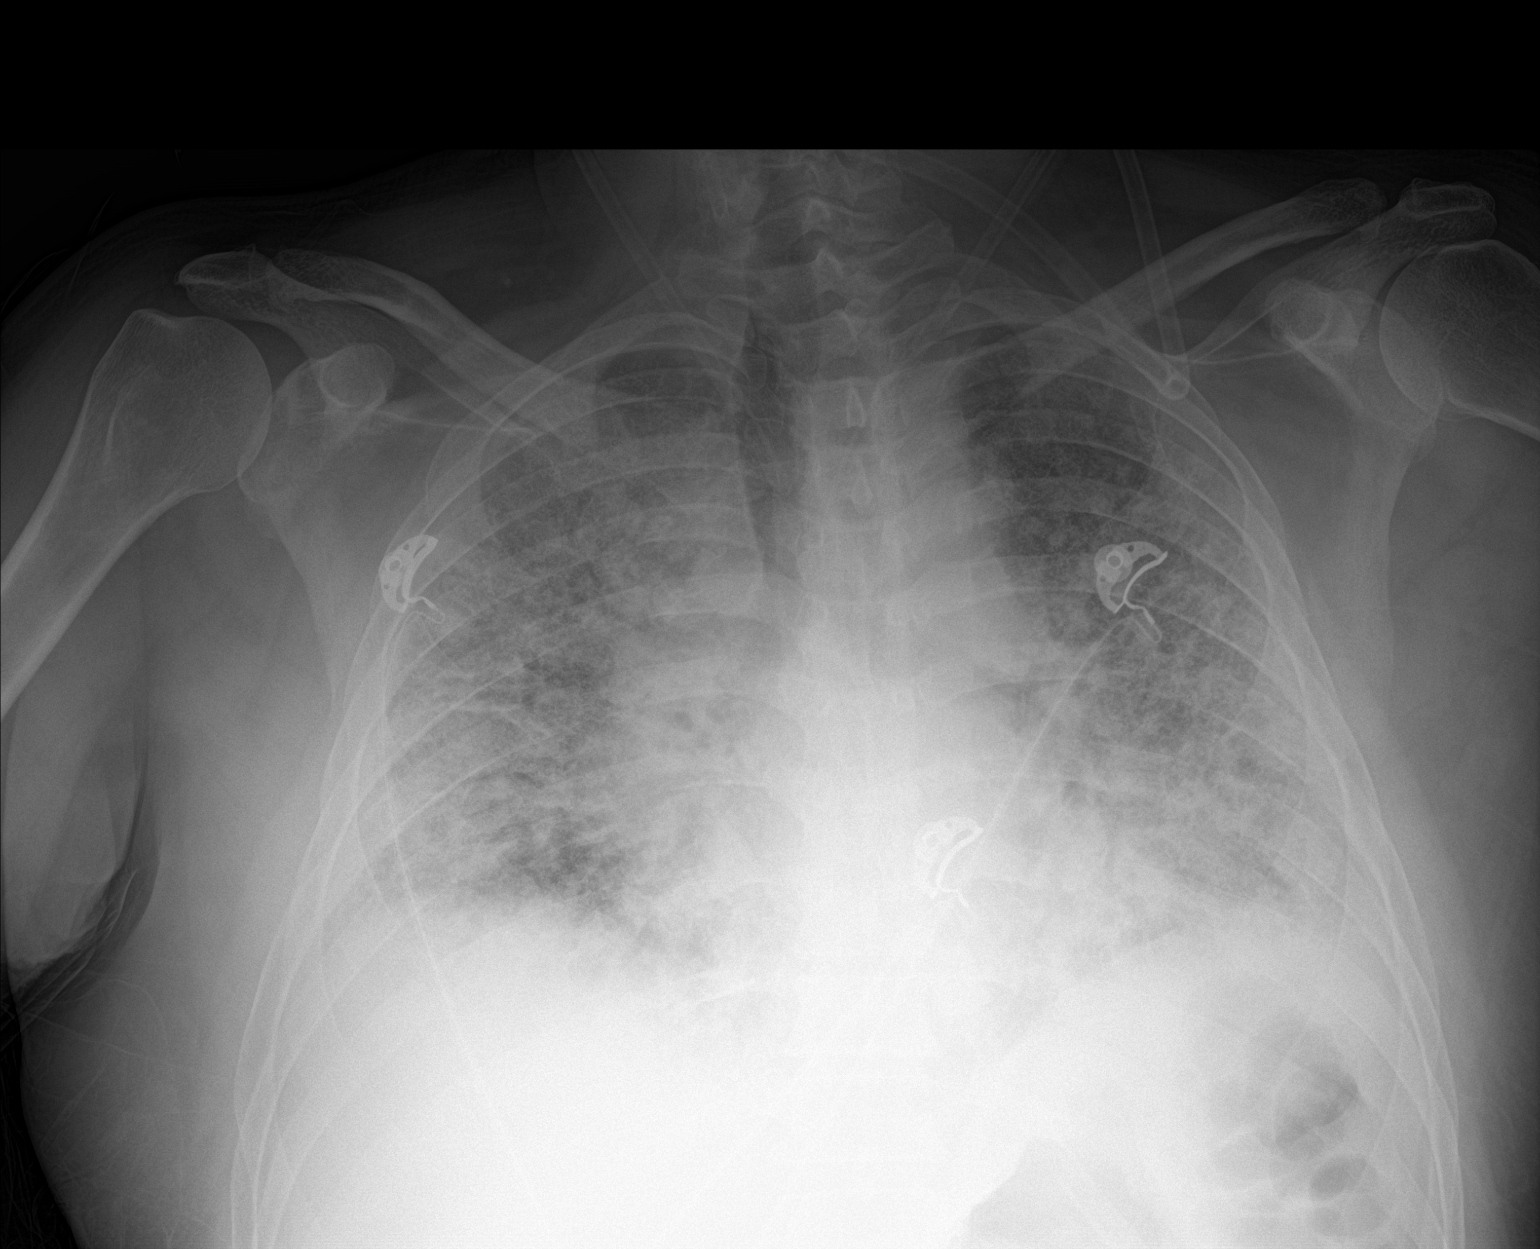

[1 of 1 positions shown; findings below may reference images not displayed]

FINDINGS: Heart size stable. Progressive diffuse severe bilateral interstitial
infiltrates are again noted. No pleural effusion or pneumothorax. No
acute bony abnormality.
IMPRESSION: Progressive diffuse severe bilateral interstitial infiltrates.
Findings consistent with severe COVID pneumonia given patient's
history.

## 2023-04-09 IMAGING — DX DG CHEST 1V PORT
1 series · 1 of 1 positions shown · non-contrast
Comparison: 08/07/2020.  CT 08/04/2020.

CLINICAL DATA: Shortness of breath.  Cough.  COVID positive.

EXAM:
PORTABLE CHEST 1 VIEW

[chest ap]
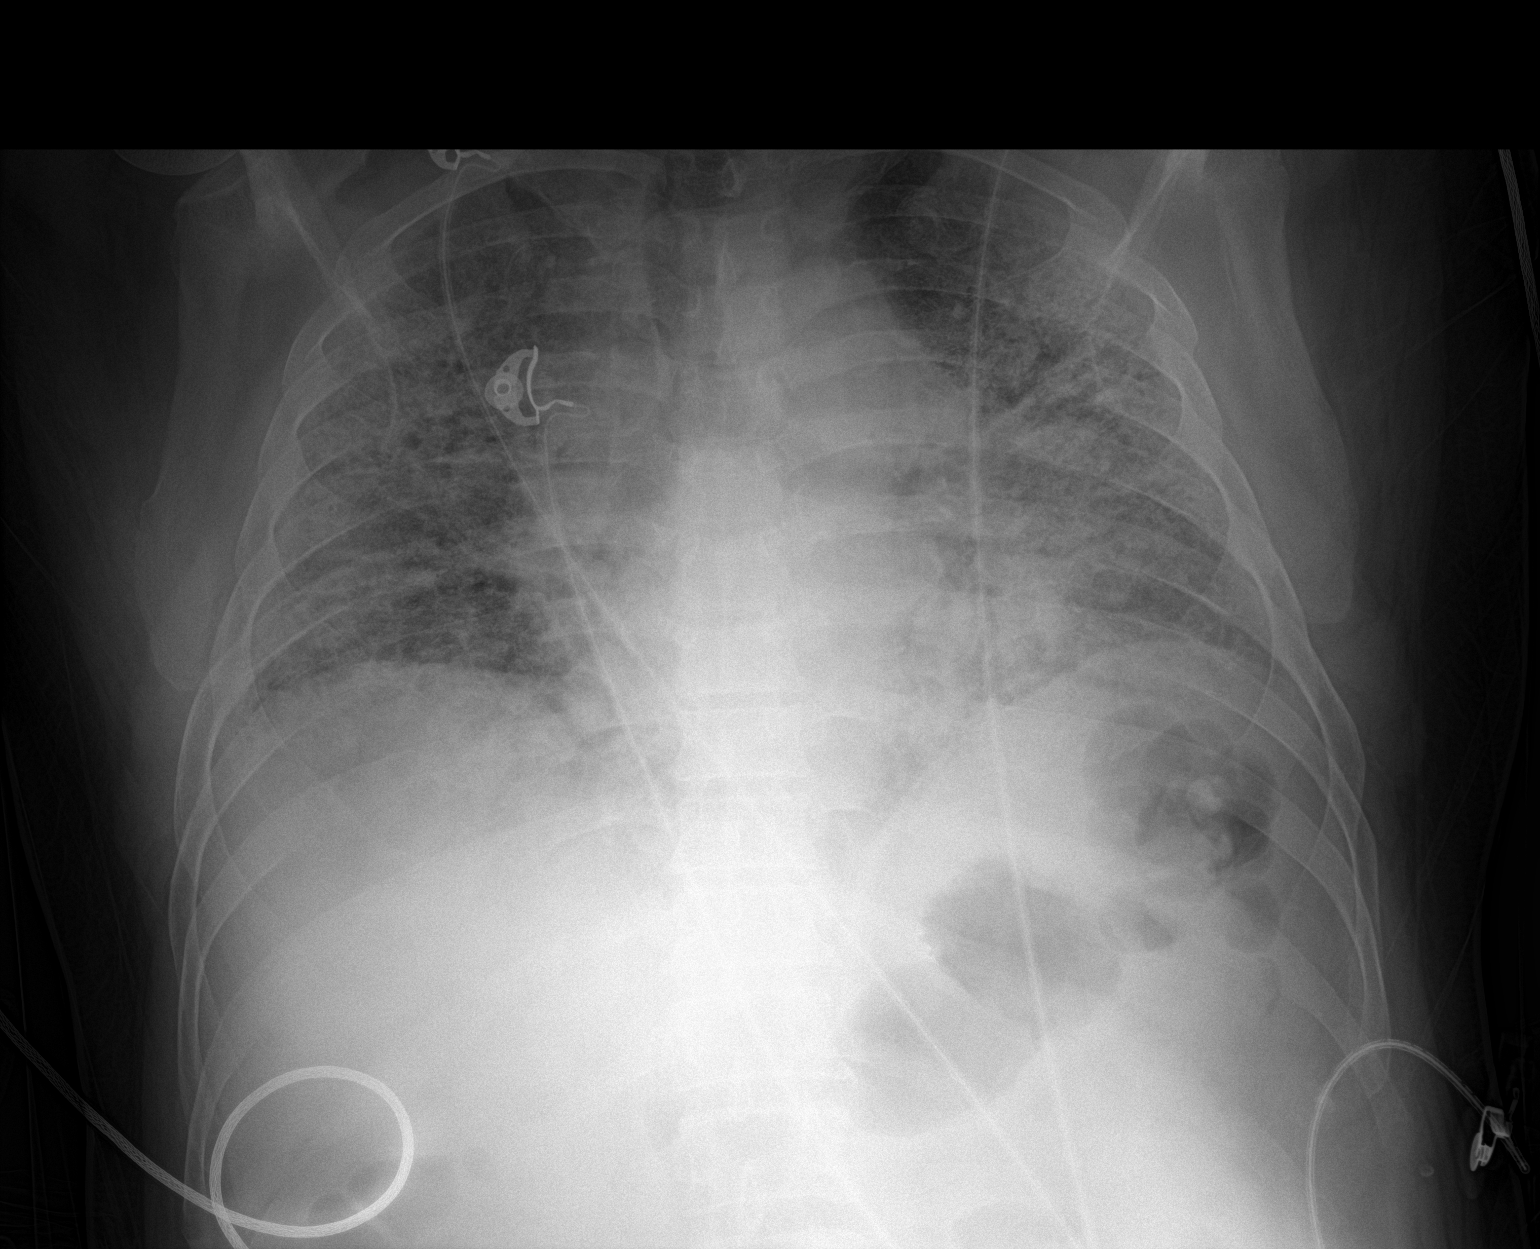

[1 of 1 positions shown; findings below may reference images not displayed]

FINDINGS: Heart size stable. Diffuse severe bilateral pulmonary infiltrates,
unchanged from prior exam. No pleural effusion or pneumothorax.
IMPRESSION: Diffuse severe bilateral pulmonary infiltrates, unchanged from prior
exam.

## 2023-04-10 IMAGING — DX DG CHEST 1V PORT
1 series · 1 of 1 positions shown · non-contrast
Comparison: 08/08/2020.

CLINICAL DATA: Shortness of breath and respiratory failure.

EXAM:
PORTABLE CHEST 1 VIEW

[chest ap]
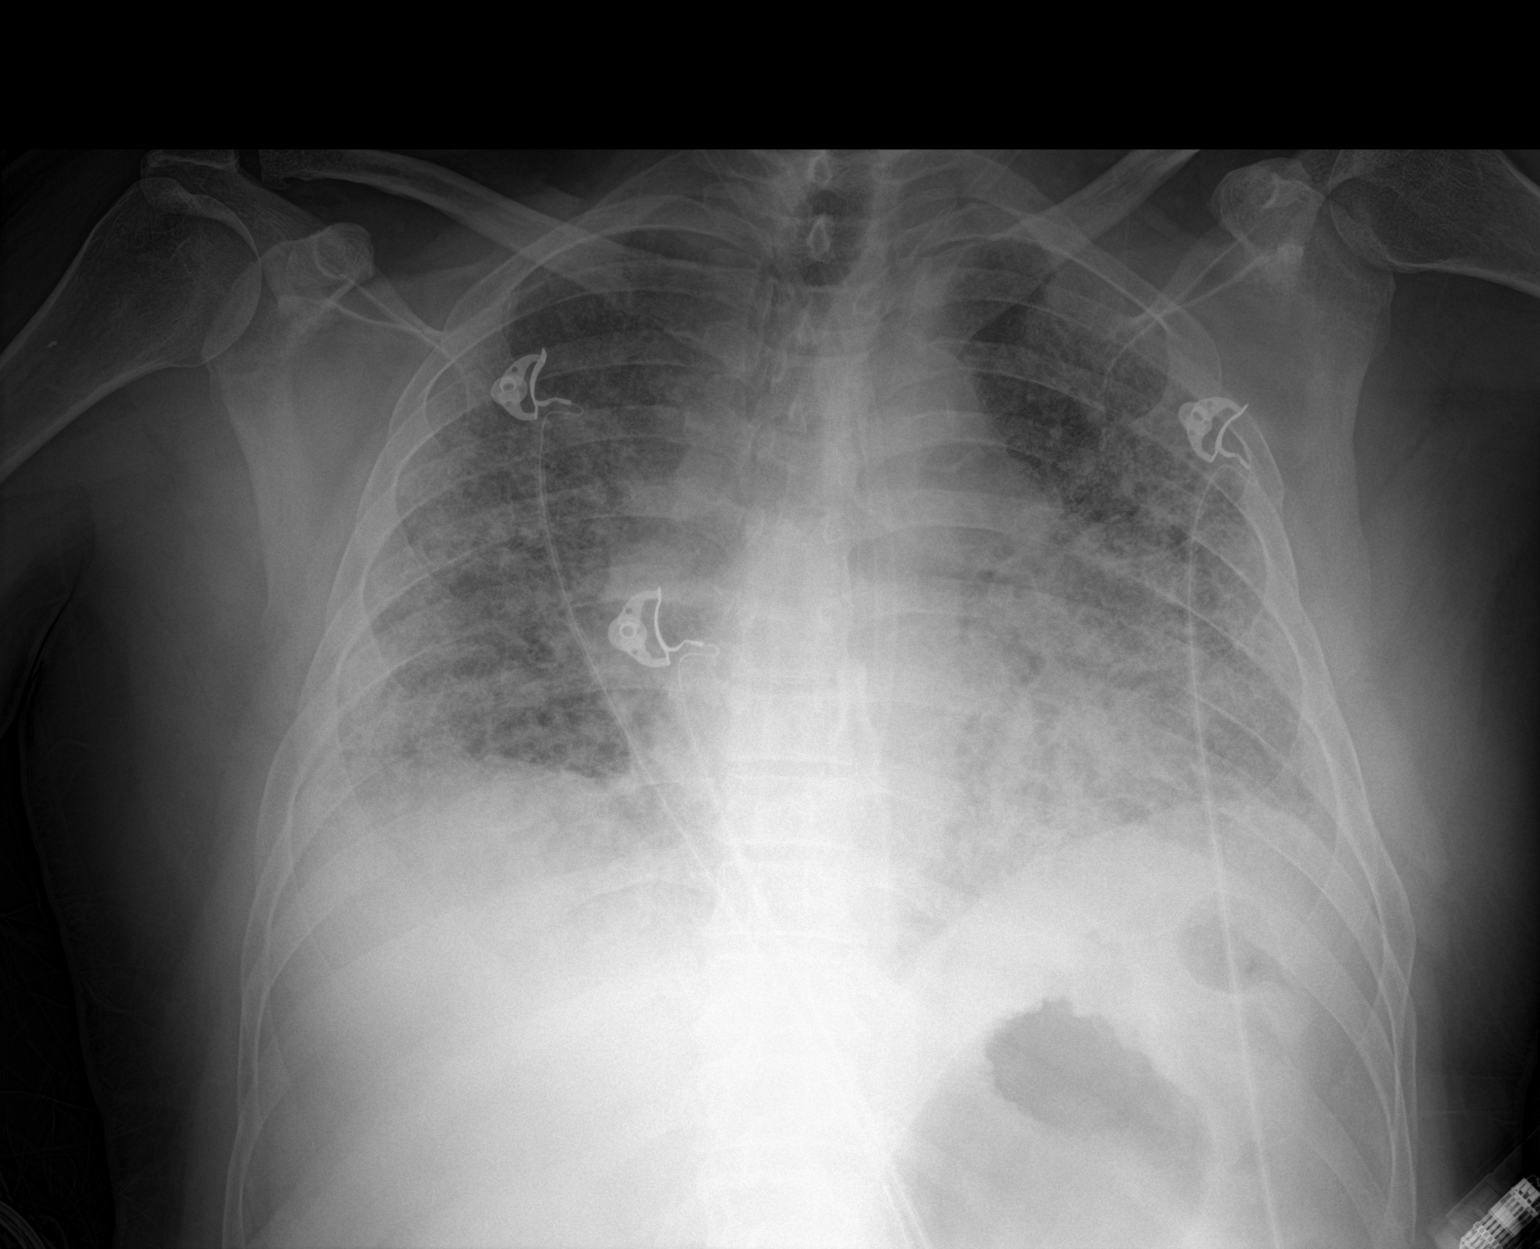

[1 of 1 positions shown; findings below may reference images not displayed]

FINDINGS: Widespread pulmonary infiltrates superimposed upon pulmonary
fibrosis as seen previously. No change since yesterday. No lobar
consolidation or collapse. No visible effusion.
IMPRESSION: No change in widespread pulmonary infiltrates superimposed on
chronic lung disease.
